# Patient Record
Sex: Female | Born: 1970 | Race: Black or African American | Hispanic: No | Marital: Single | Smoking: Current every day smoker
Health system: Southern US, Community
[De-identification: ages and names within clinical notes are randomized; demographics above are authoritative.]

## PROBLEM LIST (undated history)

## (undated) DIAGNOSIS — L0231 Cutaneous abscess of buttock: Secondary | ICD-10-CM

## (undated) DIAGNOSIS — E119 Type 2 diabetes mellitus without complications: Secondary | ICD-10-CM

## (undated) DIAGNOSIS — E785 Hyperlipidemia, unspecified: Secondary | ICD-10-CM

## (undated) DIAGNOSIS — I10 Essential (primary) hypertension: Secondary | ICD-10-CM

## (undated) HISTORY — PX: OTHER SURGICAL HISTORY: SHX169

## (undated) HISTORY — DX: Cutaneous abscess of buttock: L02.31

## (undated) HISTORY — DX: Type 2 diabetes mellitus without complications: E11.9

## (undated) HISTORY — PX: ABDOMINAL HYSTERECTOMY: SHX81

## (undated) HISTORY — DX: Hyperlipidemia, unspecified: E78.5

## (undated) HISTORY — DX: Essential (primary) hypertension: I10

---

## 2004-06-10 ENCOUNTER — Emergency Department: Payer: Self-pay | Admitting: Emergency Medicine

## 2007-02-25 ENCOUNTER — Emergency Department: Payer: Self-pay | Admitting: Unknown Physician Specialty

## 2007-06-05 ENCOUNTER — Emergency Department: Payer: Self-pay | Admitting: Emergency Medicine

## 2007-08-31 ENCOUNTER — Emergency Department: Payer: Self-pay | Admitting: Emergency Medicine

## 2008-09-12 ENCOUNTER — Emergency Department: Payer: Self-pay | Admitting: Emergency Medicine

## 2009-10-25 ENCOUNTER — Emergency Department: Payer: Self-pay | Admitting: Emergency Medicine

## 2009-11-06 ENCOUNTER — Emergency Department: Payer: Self-pay | Admitting: Emergency Medicine

## 2010-10-08 LAB — HM PAP SMEAR

## 2011-01-26 ENCOUNTER — Emergency Department: Payer: Self-pay | Admitting: Emergency Medicine

## 2013-04-28 ENCOUNTER — Inpatient Hospital Stay: Payer: Self-pay | Admitting: Obstetrics & Gynecology

## 2013-04-28 LAB — CBC WITH DIFFERENTIAL/PLATELET
BASOS PCT: 0.8 %
Basophil #: 0.1 10*3/uL (ref 0.0–0.1)
Eosinophil #: 0 10*3/uL (ref 0.0–0.7)
Eosinophil %: 0.3 %
HCT: 43 % (ref 35.0–47.0)
HGB: 14.2 g/dL (ref 12.0–16.0)
Lymphocyte #: 1.8 10*3/uL (ref 1.0–3.6)
Lymphocyte %: 9.8 %
MCH: 29.8 pg (ref 26.0–34.0)
MCHC: 33 g/dL (ref 32.0–36.0)
MCV: 90 fL (ref 80–100)
MONO ABS: 0.9 x10 3/mm (ref 0.2–0.9)
Monocyte %: 4.9 %
NEUTROS ABS: 15 10*3/uL — AB (ref 1.4–6.5)
Neutrophil %: 84.2 %
PLATELETS: 267 10*3/uL (ref 150–440)
RBC: 4.76 10*6/uL (ref 3.80–5.20)
RDW: 13.9 % (ref 11.5–14.5)
WBC: 17.8 10*3/uL — ABNORMAL HIGH (ref 3.6–11.0)

## 2013-04-28 LAB — COMPREHENSIVE METABOLIC PANEL
ALBUMIN: 3.5 g/dL (ref 3.4–5.0)
ALK PHOS: 111 U/L
ALT: 13 U/L (ref 12–78)
Anion Gap: 11 (ref 7–16)
BILIRUBIN TOTAL: 0.5 mg/dL (ref 0.2–1.0)
BUN: 6 mg/dL — AB (ref 7–18)
CHLORIDE: 106 mmol/L (ref 98–107)
Calcium, Total: 8.7 mg/dL (ref 8.5–10.1)
Co2: 30 mmol/L (ref 21–32)
Creatinine: 0.91 mg/dL (ref 0.60–1.30)
EGFR (Non-African Amer.): >60
GLUCOSE: 107 mg/dL — AB (ref 65–99)
OSMOLALITY: 291 (ref 275–301)
POTASSIUM: 3.5 mmol/L (ref 3.5–5.1)
SGOT(AST): 16 U/L (ref 15–37)
Sodium: 147 mmol/L — ABNORMAL HIGH (ref 136–145)
TOTAL PROTEIN: 7.9 g/dL (ref 6.4–8.2)

## 2013-04-28 LAB — URINALYSIS, COMPLETE
Bilirubin,UR: NEGATIVE
Glucose,UR: NEGATIVE mg/dL (ref 0–75)
Ketone: NEGATIVE
Nitrite: POSITIVE
PH: 5 (ref 4.5–8.0)
RBC,UR: 11 /HPF (ref 0–5)
Specific Gravity: 1.02 (ref 1.003–1.030)
Squamous Epithelial: 4

## 2013-04-28 LAB — LIPASE, BLOOD: Lipase: 87 U/L (ref 73–393)

## 2013-04-28 LAB — GENTAMICIN LEVEL, RANDOM: Gentamicin, Random: 0.8 ug/mL

## 2013-04-29 LAB — CBC WITH DIFFERENTIAL/PLATELET
Basophil #: 0.1 10*3/uL (ref 0.0–0.1)
Basophil %: 0.4 %
EOS PCT: 0.1 %
Eosinophil #: 0 10*3/uL (ref 0.0–0.7)
HCT: 37.2 % (ref 35.0–47.0)
HGB: 12.3 g/dL (ref 12.0–16.0)
Lymphocyte #: 1.6 10*3/uL (ref 1.0–3.6)
Lymphocyte %: 8.6 %
MCH: 29.8 pg (ref 26.0–34.0)
MCHC: 33.1 g/dL (ref 32.0–36.0)
MCV: 90 fL (ref 80–100)
Monocyte #: 0.7 x10 3/mm (ref 0.2–0.9)
Monocyte %: 4 %
NEUTROS PCT: 86.9 %
Neutrophil #: 16.3 10*3/uL — ABNORMAL HIGH (ref 1.4–6.5)
Platelet: 229 10*3/uL (ref 150–440)
RBC: 4.14 10*6/uL (ref 3.80–5.20)
RDW: 14 % (ref 11.5–14.5)
WBC: 18.7 10*3/uL — ABNORMAL HIGH (ref 3.6–11.0)

## 2013-04-29 LAB — GENTAMICIN LEVEL, PEAK: Gentamicin, Peak: 14.3 ug/mL (ref 4.0–8.0)

## 2013-04-30 LAB — CREATININE, SERUM
Creatinine: 0.71 mg/dL (ref 0.60–1.30)
EGFR (Non-African Amer.): >60

## 2013-05-01 LAB — HEMOGLOBIN: HGB: 11.7 g/dL — ABNORMAL LOW (ref 12.0–16.0)

## 2013-05-01 LAB — GENTAMICIN LEVEL, RANDOM: Gentamicin, Random: 5.5 ug/mL

## 2013-05-02 LAB — CBC WITH DIFFERENTIAL/PLATELET
BASOS ABS: 0.1 10*3/uL (ref 0.0–0.1)
Basophil %: 0.4 %
Eosinophil #: 0.1 10*3/uL (ref 0.0–0.7)
Eosinophil %: 0.4 %
HCT: 34.5 % — ABNORMAL LOW (ref 35.0–47.0)
HGB: 10.9 g/dL — ABNORMAL LOW (ref 12.0–16.0)
LYMPHS ABS: 0.9 10*3/uL — AB (ref 1.0–3.6)
Lymphocyte %: 6.7 %
MCH: 28.1 pg (ref 26.0–34.0)
MCHC: 31.5 g/dL — ABNORMAL LOW (ref 32.0–36.0)
MCV: 89 fL (ref 80–100)
MONO ABS: 0.9 x10 3/mm (ref 0.2–0.9)
MONOS PCT: 6.4 %
NEUTROS ABS: 12.2 10*3/uL — AB (ref 1.4–6.5)
NEUTROS PCT: 86.1 %
PLATELETS: 333 10*3/uL (ref 150–440)
RBC: 3.86 10*6/uL (ref 3.80–5.20)
RDW: 14 % (ref 11.5–14.5)
WBC: 14.2 10*3/uL — AB (ref 3.6–11.0)

## 2013-05-02 LAB — PATHOLOGY REPORT

## 2013-05-03 LAB — CULTURE, BLOOD (SINGLE)

## 2013-12-06 ENCOUNTER — Emergency Department: Payer: Self-pay | Admitting: Emergency Medicine

## 2013-12-06 LAB — COMPREHENSIVE METABOLIC PANEL
AST: 13 U/L — AB (ref 15–37)
Albumin: 3.6 g/dL (ref 3.4–5.0)
Alkaline Phosphatase: 109 U/L
Anion Gap: 6 — ABNORMAL LOW (ref 7–16)
BILIRUBIN TOTAL: 0.4 mg/dL (ref 0.2–1.0)
BUN: 5 mg/dL — AB (ref 7–18)
CO2: 26 mmol/L (ref 21–32)
CREATININE: 0.85 mg/dL (ref 0.60–1.30)
Calcium, Total: 8.6 mg/dL (ref 8.5–10.1)
Chloride: 109 mmol/L — ABNORMAL HIGH (ref 98–107)
GLUCOSE: 105 mg/dL — AB (ref 65–99)
Osmolality: 279 (ref 275–301)
Potassium: 4 mmol/L (ref 3.5–5.1)
SGPT (ALT): 16 U/L
Sodium: 141 mmol/L (ref 136–145)
TOTAL PROTEIN: 8 g/dL (ref 6.4–8.2)

## 2013-12-06 LAB — CBC WITH DIFFERENTIAL/PLATELET
Basophil #: 0 10*3/uL (ref 0.0–0.1)
Basophil %: 0.3 %
EOS ABS: 0 10*3/uL (ref 0.0–0.7)
Eosinophil %: 0.1 %
HCT: 44 % (ref 35.0–47.0)
HGB: 14.1 g/dL (ref 12.0–16.0)
Lymphocyte #: 0.9 10*3/uL — ABNORMAL LOW (ref 1.0–3.6)
Lymphocyte %: 7.3 %
MCH: 29.5 pg (ref 26.0–34.0)
MCHC: 32.1 g/dL (ref 32.0–36.0)
MCV: 92 fL (ref 80–100)
MONO ABS: 0.6 x10 3/mm (ref 0.2–0.9)
MONOS PCT: 4.6 %
Neutrophil #: 11.2 10*3/uL — ABNORMAL HIGH (ref 1.4–6.5)
Neutrophil %: 87.7 %
Platelet: 239 10*3/uL (ref 150–440)
RBC: 4.78 10*6/uL (ref 3.80–5.20)
RDW: 14.4 % (ref 11.5–14.5)
WBC: 12.8 10*3/uL — ABNORMAL HIGH (ref 3.6–11.0)

## 2013-12-06 LAB — TROPONIN I: Troponin-I: <0.02 ng/mL

## 2013-12-07 LAB — URINALYSIS, COMPLETE
BILIRUBIN, UR: NEGATIVE
Bacteria: NONE SEEN
Blood: NEGATIVE
GLUCOSE, UR: NEGATIVE mg/dL (ref 0–75)
Ketone: NEGATIVE
LEUKOCYTE ESTERASE: NEGATIVE
Nitrite: NEGATIVE
Ph: 8 (ref 4.5–8.0)
Protein: NEGATIVE
RBC,UR: 6 /HPF (ref 0–5)
Specific Gravity: 1.016 (ref 1.003–1.030)
Squamous Epithelial: 1
WBC UR: NONE SEEN /HPF (ref 0–5)

## 2013-12-10 LAB — INFLUENZA A,B,H1N1 - PCR (ARMC)
H1N1 flu by pcr: NOT DETECTED
Influenza A By PCR: NEGATIVE
Influenza B By PCR: NEGATIVE

## 2014-06-15 NOTE — Op Note (Signed)
PATIENT NAME:  Kelsey Sampson, PROVENCIO MR#:  [id number] DATE OF BIRTH:  Aug 14, 1970  DATE OF PROCEDURE:  04/30/2013  PREOPERATIVE DIAGNOSES: Abdominal pain, bilateral tubo-ovarian abscess.   POSTOPERATIVE DIAGNOSES: Abdominal pain, bilateral tubo-ovarian abscess.    PROCEDURE: Laparoscopy, laparotomy, right salpingectomy, left salpingo-oophorectomy, lysis of adhesions,   SURGEON: R. Annamarie Major, M.D.   ANESTHESIA: General.   BLOOD LOSS: 500 mL.   COMPLICATIONS: None.   FINDINGS: The patient had significant inflammation and adhesions associated with the bilateral tubo-ovarian abscess formations that involved many bowel, colon structures in the pelvis but minimally involved the uterus. The adnexa, including the ovaries, were significantly involved, with hopeful preservation of the right ovary.   DISPOSITION: To the recovery room in stable condition.   TECHNIQUE: The patient is prepped and draped in the usual sterile fashion after adequate general anesthesia is obtained in the dorsal lithotomy position. Foley catheter is inserted. Cervix is grasped with a tenaculum for manipulation purposes.   Attention is then turned to the abdomen where a Veress needle is inserted through a 5 mm infraumbilical incision after Marcaine is used to anesthetize the skin. Veress needle placement is confirmed using the hanging drop technique, and the abdomen is then insufflated with CO2 gas. A 5 mm trocar is then inserted under direct visualization with the laparoscope with no injuries or bleeding noted. The patient is placed in Trendelenburg positioning. The above-mentioned findings are visualized with significant adhesions with bowel, colon to the adnexa and uterus. There is also significant exudate and inflammatory tissue and purulent fluid. Trocars are placed in the left and right lower quadrants 5 mL each under direct visualization with the laparoscope with no injuries or bleeding noted. Careful dissection is performed  over a significant amount of time to free up to the adnexa, uterus and colon tissues and bowel tissues. It is determined that the masses associated with the tubo-ovarian abscesses are significant and disease prevalent and needs to have further procedures done but cannot be accomplished by laparoscopy, so a decision is made to convert to laparotomy.   A low transverse skin incision is created with a scalpel down to the level of the rectus fascia which is then dissected bilaterally. The rectus muscles are separated from the fascia and then separated in the midline once adequate dissection is performed and the peritoneum is penetrated and the gas is let out from the laparoscopy. A Balfour retractor is inserted for retraction purposes, and the bladder is inferiorly retracted as well with a bladder blade.   Careful dissection, mostly by blunt dissection as well as sharp dissection, is performed to free up the adnexa from surrounding tissues. The left adnexal mass associated with this is removed with careful dissection on the lateral aspect to avoid the ureter. The ureter is palpated to be out of harm's way after completing this part of the dissection. Careful clamping across the infundibulopelvic blood vessels and ligaments as well as the uterine artery is done in the course of this dissection, and this mass that probably includes the left ovary and certainly includes the tubal mass, is removed and sent to pathology for further review. The uterus is normal and otherwise nonbleeding on this side. On the right side, the tube is dissected with clamping and cutting of the main blood supply and preservation of the right ovary on this side. Careful inspection of colon, bowel, ureters, bladder and uterus reveals no other significant disease or injury. The pelvic cavity is irrigated with copious amounts of saline.  Approximately 3000 mL is used to irrigate the pelvis and abdomen, with aspiration of as much fluid as possible.  Excellent hemostasis is visualized throughout the operative site. A drain is placed in the posterior cul-de-sac. We used a JP drain and it is exited through the right laparoscopic port site. The patient is leveled from the Trendelenburg position. Then, the rectus fascia is closed with a 0 PDS suture. Skin is closed with surgical clips at the laparotomy site and with Dermabond at laparoscopy sites. The drain is tied in place with nylon suture and is placed to bulb grenade suction. The patient goes to the recovery room in stable condition, tolerating the procedure well. All sponge, instrument and needle counts are correct at the conclusion of the case. The patient goes to recovery room with a Foley catheter and with the JP drain in place and with continued use of antibiotics.   ____________________________ R. Annamarie MajorPaul Rayel Santizo, MD rph:gb D: 04/30/2013 17:34:21 ET T: 04/30/2013 22:26:07 ET JOB#: [id number]  cc: Dierdre Searles. Paul Airis Barbee, MD, <Dictator> Nadara MustardOBERT P Sanav Remer MD ELECTRONICALLY SIGNED 04/30/2013 23:30

## 2014-08-29 ENCOUNTER — Emergency Department
Admission: EM | Admit: 2014-08-29 | Discharge: 2014-08-29 | Discharge status: Against Medical Advice | Payer: No Typology Code available for payment source | Attending: Emergency Medicine | Admitting: Emergency Medicine

## 2014-08-29 ENCOUNTER — Encounter: Payer: Self-pay | Admitting: Urgent Care

## 2014-08-29 DIAGNOSIS — S3992XA Unspecified injury of lower back, initial encounter: Secondary | ICD-10-CM | POA: Diagnosis not present

## 2014-08-29 DIAGNOSIS — Y92410 Unspecified street and highway as the place of occurrence of the external cause: Secondary | ICD-10-CM | POA: Diagnosis not present

## 2014-08-29 DIAGNOSIS — Z720 Tobacco use: Secondary | ICD-10-CM | POA: Insufficient documentation

## 2014-08-29 DIAGNOSIS — S199XXA Unspecified injury of neck, initial encounter: Secondary | ICD-10-CM | POA: Insufficient documentation

## 2014-08-29 DIAGNOSIS — Y9389 Activity, other specified: Secondary | ICD-10-CM | POA: Diagnosis not present

## 2014-08-29 DIAGNOSIS — Y998 Other external cause status: Secondary | ICD-10-CM | POA: Insufficient documentation

## 2014-08-29 MED ORDER — OXYCODONE-ACETAMINOPHEN 5-325 MG PO TABS
ORAL_TABLET | ORAL | Status: AC
  Filled 2014-08-29: qty 1

## 2014-08-29 MED ORDER — OXYCODONE-ACETAMINOPHEN 5-325 MG PO TABS
1.0000 | ORAL_TABLET | Freq: Once | ORAL | Status: AC
  Administered 2014-08-29: 1 via ORAL
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
## 2014-08-29 NOTE — ED Notes (Signed)
Called from lobby for room assignment, no response  

## 2017-09-02 LAB — HM HIV SCREENING LAB: HM HIV Screening: NEGATIVE

## 2017-09-08 ENCOUNTER — Encounter: Payer: Self-pay | Admitting: *Deleted

## 2017-09-08 ENCOUNTER — Other Ambulatory Visit: Payer: Self-pay

## 2017-09-08 ENCOUNTER — Emergency Department
Admission: EM | Admit: 2017-09-08 | Discharge: 2017-09-08 | Disposition: A | Discharge status: Home / Self Care | Payer: Self-pay | Attending: Emergency Medicine | Admitting: Emergency Medicine

## 2017-09-08 DIAGNOSIS — L0231 Cutaneous abscess of buttock: Secondary | ICD-10-CM | POA: Insufficient documentation

## 2017-09-08 DIAGNOSIS — F17200 Nicotine dependence, unspecified, uncomplicated: Secondary | ICD-10-CM | POA: Insufficient documentation

## 2017-09-08 MED ORDER — CLINDAMYCIN HCL 150 MG PO CAPS
300.0000 mg | ORAL_CAPSULE | Freq: Once | ORAL | Status: AC
  Administered 2017-09-08: 300 mg via ORAL
  Filled 2017-09-08: qty 2

## 2017-09-08 MED ORDER — CLINDAMYCIN HCL 300 MG PO CAPS: 300.0000 mg | ORAL_CAPSULE | Freq: Four times a day (QID) | ORAL | 0 refills | Status: DC

## 2017-09-08 NOTE — ED Provider Notes (Signed)
[facility] Emergency Department Provider Note  ____________________________________________  Time seen: Approximately 7:30 PM  I have reviewed the triage vital signs and the nursing notes.   HISTORY  Chief Complaint Abscess    HPI Kelsey Sampson is a 47 y.o. female who presents the emergency department complaining of abscess to her buttocks.  Patient reports that this is the ninth time she has had an abscess in the same location.  She states that multiple times it will rupture, drained for a few days, then resolved.  Patient has been treated intermittently with incision and drainage, antibiotics, or it has ruptured and spontaneously resolved without medical care.  Patient reports that this is on her buttocks and not around the perirectal area.  Patient denies any IBS, Crohn's, celiac, colitis, diverticulitis history.  She denies any abdominal pain, diarrhea, rectal bleeding.  Patient denies any other systemic complaints of fevers or chills, nausea, vomiting, diarrhea or constipation.  No medications for this complaint prior to arrival.  Patient reports that it had not ruptured over the past several days but when she arrived to the emergency department it ruptured spontaneously.    History reviewed. No pertinent past medical history.  There are no active problems to display for this patient.   Past Surgical History:  Procedure Laterality Date  . ABDOMINAL HYSTERECTOMY      Prior to Admission medications   Medication Sig Start Date End Date Taking? Authorizing Provider  clindamycin (CLEOCIN) 300 MG capsule Take 1 capsule (300 mg total) by mouth 4 (four) times daily. 09/08/17   Portia Wisdom, Delorise RoyalsJonathan D, PA-C    Allergies Patient has no known allergies.  History reviewed. No pertinent family history.  Social History Social History   Tobacco Use  . Smoking status: Current Every Day Smoker  . Smokeless tobacco: Never Used  Substance Use Topics  . Alcohol  use: No  . Drug use: Not on file     Review of Systems  Constitutional: No fever/chills Eyes: No visual changes.  Cardiovascular: no chest pain. Respiratory: no cough. No SOB. Gastrointestinal: No abdominal pain.  No nausea, no vomiting.  No diarrhea.  No constipation. Genitourinary: Negative for dysuria. No hematuria Musculoskeletal: Negative for musculoskeletal pain. Skin: Negative for rash, abrasions, lacerations, ecchymosis.  Positive for abscess to the right buttocks. Neurological: Negative for headaches, focal weakness or numbness. 10-point ROS otherwise negative.  ____________________________________________   PHYSICAL EXAM:  VITAL SIGNS: ED Triage Vitals  Enc Vitals Group     BP 09/08/17 1907 (!) 148/74     Pulse Rate 09/08/17 1907 85     Resp 09/08/17 1907 16     Temp 09/08/17 1907 98.6 F (37 C)     Temp Source 09/08/17 1907 Oral     SpO2 09/08/17 1907 99 %     Weight 09/08/17 1908 259 lb (117.5 kg)     Height 09/08/17 1908 5\' 9"  (1.753 m)     Head Circumference --      Peak Flow --      Pain Score 09/08/17 1907 6     Pain Loc --      Pain Edu? --      Excl. in GC? --      Constitutional: Alert and oriented. Well appearing and in no acute distress. Eyes: Conjunctivae are normal. PERRL. EOMI. Head: Atraumatic. Neck: No stridor.    Cardiovascular: Normal rate, regular rhythm. Normal S1 and S2.  Good peripheral circulation. Respiratory: Normal respiratory effort without tachypnea  or retractions. Lungs CTAB. Good air entry to the bases with no decreased or absent breath sounds. Gastrointestinal: Bowel sounds 4 quadrants. Soft and nontender to palpation. No guarding or rigidity. No palpable masses. No distention.  Visualization of the rectum is unremarkable. Musculoskeletal: Full range of motion to all extremities. No gross deformities appreciated. Neurologic:  Normal speech and language. No gross focal neurologic deficits are appreciated.  Skin:  Skin is  warm, dry and intact. No rash noted.  Small, draining skin lesion noted to the right buttocks.  This is just lateral to the intergluteal cleft.  This does not encompass the intergluteal cleft and is not in the perirectal region.  Area is draining a purulent/bloody drainage.  Palpation expresses more drainage.  On palpation, mild fluctuance. Psychiatric: Mood and affect are normal. Speech and behavior are normal. Patient exhibits appropriate insight and judgement.  Exam chaperoned by female RN ____________________________________________   LABS (all labs ordered are listed, but only abnormal results are displayed)  Labs Reviewed - No data to display ____________________________________________  EKG   ____________________________________________  RADIOLOGY   No results found.  ____________________________________________    PROCEDURES  Procedure(s) performed:    Procedures    Medications  clindamycin (CLEOCIN) capsule 300 mg (has no administration in time range)     ____________________________________________   INITIAL IMPRESSION / ASSESSMENT AND PLAN / ED COURSE  Pertinent labs & imaging results that were available during my care of the patient were reviewed by me and considered in my medical decision making (see chart for details).  Review of the [place] CSRS was performed in accordance of the [facility] prior to dispensing any controlled drugs.      Patient's diagnosis is consistent with buttocks abscess.  Patient presents the emergency department for an abscess to the right buttocks.  On exam, this is spontaneously draining a purulent/bloody drainage.  This is not in the perirectal region.  Patient reports that this is the ninth abscess she has had in the same location.  I suspect that patient does have a fistula but given overall reassuring exam, spontaneous drainage, no imaging is obtained at this time as there is no concern for perirectal involvement.  Patient will be  treated with clindamycin.  Patient is advised to follow-up with general surgery for further evaluation of possible fistula.  Patient verbalizes understanding of same..  Instructions to return for any rectal pain, rectal bleeding, increasing pain or drainage to the site.  Patient verbalizes understanding of same.  Patient is to follow-up with general surgery as described.  Patient is given ED precautions to return to the ED for any worsening or new symptoms.     ____________________________________________  FINAL CLINICAL IMPRESSION(S) / ED DIAGNOSES  Final diagnoses:  Abscess of buttock, right      NEW MEDICATIONS STARTED DURING THIS VISIT:  ED Discharge Orders        Ordered    clindamycin (CLEOCIN) 300 MG capsule  4 times daily     09/08/17 1936          This chart was dictated using voice recognition software/Dragon. Despite best efforts to proofread, errors can occur which can change the meaning. Any change was purely unintentional.    Racheal Patches, PA-C 09/08/17 1938    Myrna Blazer, MD 09/08/17 [id number]

## 2017-09-08 NOTE — ED Triage Notes (Signed)
Pt reports an abscess in the middle of her glutes that has been coming and going over the past couple months. Usually the abscess will drain on its own but this time is will not and the pain has increased.No difficulty having BMs and no fevers at home.

## 2017-09-08 NOTE — ED Notes (Signed)
This RN at bedside with PA as witness for exam

## 2017-09-15 DIAGNOSIS — L0231 Cutaneous abscess of buttock: Secondary | ICD-10-CM | POA: Insufficient documentation

## 2017-09-19 ENCOUNTER — Ambulatory Visit: Payer: Self-pay | Admitting: Surgery

## 2017-09-19 ENCOUNTER — Encounter: Payer: Self-pay | Admitting: Surgery

## 2017-09-19 VITALS — BP 130/80 | HR 79 | Ht 69.0 in | Wt 257.0 lb

## 2017-09-19 DIAGNOSIS — L0231 Cutaneous abscess of buttock: Secondary | ICD-10-CM

## 2017-09-19 NOTE — Patient Instructions (Addendum)
Start your antibobitic and return as needed. The patient is aware to call back for any questions or concerns.

## 2017-09-19 NOTE — Progress Notes (Signed)
Patient ID: Kelsey Sampson, female   DOB: April 23, 1970, 47 y.o.   MRN: [id number]  HPI Kelsey Sampson is a 47 y.o. female recently seen in the emergency room 10 days ago for a spontaneous perianal abscess.  She reported that she had significant sharp constant pain that was moderate in intensity.  Also reports spontaneous drainage of an abscess about 10 days ago and was given antibiotics.  She so far has been doing very well and her pain has subsided.  No fevers no chills.  She reports that this has been her ninth episode of of an abscess.  She is able to perform more than 4 METS of activity without any shortness of breath or chest pain  HPI  Past Medical History:  Diagnosis Date  . Abscess of buttock, left     Past Surgical History:  Procedure Laterality Date  . ABDOMINAL HYSTERECTOMY      No family history on file.  Social History Social History   Tobacco Use  . Smoking status: Current Every Day Smoker  . Smokeless tobacco: Never Used  Substance Use Topics  . Alcohol use: No  . Drug use: Not on file    No Known Allergies  Current Outpatient Medications  Medication Sig Dispense Refill  . clindamycin (CLEOCIN) 300 MG capsule Take 1 capsule (300 mg total) by mouth 4 (four) times daily. (Patient not taking: Reported on 09/19/2017) 28 capsule 0   No current facility-administered medications for this visit.      Review of Systems Full ROS  was asked and was negative except for the information on the HPI  Physical Exam Blood pressure 130/80, pulse 79, height 5\' 9"  (1.753 m), weight 257 lb (116.6 kg). CONSTITUTIONAL: NAD, awake and alert GI: The abdomen is  soft, nontender, and nondistended. There are no palpable masses. There is no hepatosplenomegaly. There are normal bowel sounds in all quadrants. Rectal , no masses, fistula or abscess. Non tender exam MUSCULOSKELETAL: Normal muscle strength and tone. No cyanosis or edema.   SKIN: Turgor is good and there are no pathologic  skin lesions or ulcers. NEUROLOGIC: Motor and sensation is grossly normal. Cranial nerves are grossly intact. PSYCH:  Oriented to person, place and time. Affect is normal.  Data Reviewed  I have personally reviewed the patient's imaging, laboratory findings and medical records.    Assessment/Plan Resolving perianal abscess.  No need for any surgical intervention.  Recommend to do sitz baths and complete the antibiotic therapy.  Also discussed with her that there is a chance that this may recur and transfer him to a fistula that will require an exam under anesthesia and fistulotomy.  At this time no need for immediate surgical intervention.  To the office as needed  Sterling Bigiego Rowena Moilanen, MD FACS General Surgeon 09/19/2017, 4:30 PM

## 2018-03-26 ENCOUNTER — Encounter: Payer: Self-pay | Admitting: Emergency Medicine

## 2018-03-26 ENCOUNTER — Emergency Department: Payer: Self-pay

## 2018-03-26 DIAGNOSIS — F17200 Nicotine dependence, unspecified, uncomplicated: Secondary | ICD-10-CM | POA: Insufficient documentation

## 2018-03-26 DIAGNOSIS — M25562 Pain in left knee: Secondary | ICD-10-CM | POA: Insufficient documentation

## 2018-03-26 NOTE — ED Triage Notes (Addendum)
Patient with complaint of left  knee pain times 5 days. Patient states that she does not recall any injury.

## 2018-03-27 ENCOUNTER — Other Ambulatory Visit: Payer: Self-pay

## 2018-03-27 ENCOUNTER — Emergency Department
Admission: EM | Admit: 2018-03-27 | Discharge: 2018-03-27 | Disposition: A | Discharge status: Home / Self Care | Payer: Self-pay | Attending: Emergency Medicine | Admitting: Emergency Medicine

## 2018-03-27 DIAGNOSIS — M25562 Pain in left knee: Secondary | ICD-10-CM

## 2018-03-27 LAB — URIC ACID: Uric Acid, Serum: 4.5 mg/dL (ref 2.5–7.1)

## 2018-03-27 MED ORDER — METHYLPREDNISOLONE SODIUM SUCC 125 MG IJ SOLR
125.0000 mg | Freq: Once | INTRAMUSCULAR | Status: AC
  Administered 2018-03-27: 125 mg via INTRAVENOUS
  Filled 2018-03-27: qty 2

## 2018-03-27 MED ORDER — TRAMADOL HCL 50 MG PO TABS: 50.0000 mg | ORAL_TABLET | Freq: Four times a day (QID) | ORAL | 0 refills | Status: AC | PRN

## 2018-03-27 MED ORDER — KETOROLAC TROMETHAMINE 30 MG/ML IJ SOLN
30.0000 mg | Freq: Once | INTRAMUSCULAR | Status: AC
  Administered 2018-03-27: 30 mg via INTRAVENOUS
  Filled 2018-03-27: qty 1

## 2018-03-27 NOTE — ED Notes (Signed)
Pt states that the last 5 days her left knee has been feeling swollen and painful. States she has not fallen and is not sure why it's swollen. Family at bedside.

## 2018-03-27 NOTE — ED Provider Notes (Signed)
[facility] Emergency Department Provider Note   First MD Initiated Contact with Patient 03/27/18 0142     (approximate)  I have reviewed the triage vital signs and the nursing notes.   HISTORY  Chief Complaint Knee Pain    HPI Kelsey Sampson is a 48 y.o. female resents to the emergency department with nontraumatic left anterior knee pain x5 days.  Patient does not recall any injury.  Patient states that pain is worse with any bending of the knee or ambulation.  Patient states current pain score is 8 out of 10.  Patient denies any fever.     Past Medical History:  Diagnosis Date  . Abscess of buttock, left     Patient Active Problem List   Diagnosis Date Noted  . Abscess of buttock, left     Past Surgical History:  Procedure Laterality Date  . ABDOMINAL HYSTERECTOMY      Prior to Admission medications   Medication Sig Start Date End Date Taking? Authorizing Provider  clindamycin (CLEOCIN) 300 MG capsule Take 1 capsule (300 mg total) by mouth 4 (four) times daily. Patient not taking: Reported on 09/19/2017 09/08/17   Cuthriell, Delorise RoyalsJonathan D, PA-C    Allergies Patient has no known allergies.  No family history on file.  Social History Social History   Tobacco Use  . Smoking status: Current Every Day Smoker  . Smokeless tobacco: Never Used  Substance Use Topics  . Alcohol use: No  . Drug use: Not on file    Review of Systems Constitutional: No fever/chills Eyes: No visual changes. ENT: No sore throat. Cardiovascular: Denies chest pain. Respiratory: Denies shortness of breath. Gastrointestinal: No abdominal pain.  No nausea, no vomiting.  No diarrhea.  No constipation. Genitourinary: Negative for dysuria. Musculoskeletal: Negative for neck pain.  Negative for back pain. Integumentary: Negative for rash. Neurological: Negative for headaches, focal weakness or numbness.   ____________________________________________   PHYSICAL  EXAM:  VITAL SIGNS: ED Triage Vitals  Enc Vitals Group     BP 03/26/18 2020 (!) 159/88     Pulse Rate 03/26/18 2019 79     Resp 03/26/18 2019 18     Temp 03/26/18 2019 98.8 F (37.1 C)     Temp src --      SpO2 03/26/18 2019 96 %     Weight 03/26/18 2019 108.9 kg (240 lb)     Height 03/26/18 2019 1.753 m (5\' 9" )     Head Circumference --      Peak Flow --      Pain Score 03/26/18 2019 10     Pain Loc --      Pain Edu? --      Excl. in GC? --     Constitutional: Alert and oriented. Well appearing and in no acute distress. Eyes: Conjunctivae are normal.  Mouth/Throat: Mucous membranes are moist.  Oropharynx non-erythematous. Neck: No stridor.   Cardiovascular: Normal rate, regular rhythm. Good peripheral circulation. Grossly normal heart sounds. Respiratory: Normal respiratory effort.  No retractions. Lungs CTAB. Gastrointestinal: Soft and nontender. No distention.  Musculoskeletal: Pain with palpation superior portion of the knee.  No palpable fluid collection.  Pain with anterior valgus varus stress test. Neurologic:  Normal speech and language. No gross focal neurologic deficits are appreciated.  Skin:  Skin is warm, dry and intact. No rash noted.  ____________________________________________   LABS (all labs ordered are listed, but only abnormal results are displayed)  Labs Reviewed  URIC ACID    RADIOLOGY I, Shawnee N BROWN, personally viewed and evaluated these images (plain radiographs) as part of my medical decision making, as well as reviewing the written report by the radiologist.  ED MD interpretation: Possible small joint effusion but otherwise negative x-ray per the radiologist guarding x-ray of the left knee.  Official radiology report(s): Dg Knee Complete 4 Views Left  Result Date: 03/26/2018 CLINICAL DATA:  48 year old female with 5 days of left knee pain, medial pain. No known injury. EXAM: LEFT KNEE - COMPLETE 4+ VIEW COMPARISON:  None. FINDINGS:  Preserved joint spaces and alignment. Possible small suprapatellar joint effusion. Patella intact. Normal bone mineralization and no osseous abnormality identified. IMPRESSION: Possible small joint effusion but otherwise negative radiographic appearance of the left knee. Electronically Signed   By: Odessa Fleming M.D.   On: 03/26/2018 20:48     Procedures   ____________________________________________   INITIAL IMPRESSION / ASSESSMENT AND PLAN / ED COURSE  As part of my medical decision making, I reviewed the following data within the electronic MEDICAL RECORD NUMBER   48 year old female presented with above-stated history and physical exam secondary to nontraumatic left knee pain.  Torrential diagnosis include arthritis ligamentous injury gout.  Of note patient had no calf or posterior knee pain therefore possibility of DVT unlikely.  X-ray revealed no evidence of fracture dislocation small joint effusion noted.  Uric acid obtained which was negative at 4.5.  Patient given IV Toradol and Solu-Medrol with pain improvement knee immobilizer applied patient given crutches with recommendation to follow-up with orthopedic surgery for further outpatient evaluation. ____________________________________________  FINAL CLINICAL IMPRESSION(S) / ED DIAGNOSES  Final diagnoses:  Acute pain of left knee     MEDICATIONS GIVEN DURING THIS VISIT:  Medications  ketorolac (TORADOL) 30 MG/ML injection 30 mg (30 mg Intravenous Given 03/27/18 0213)  methylPREDNISolone sodium succinate (SOLU-MEDROL) 125 mg/2 mL injection 125 mg (125 mg Intravenous Given 03/27/18 0213)     ED Discharge Orders    None       Note:  This document was prepared using Dragon voice recognition software and may include unintentional dictation errors.    Darci Current, MD 03/27/18 [phone]

## 2018-10-27 ENCOUNTER — Other Ambulatory Visit: Payer: Self-pay

## 2018-10-27 ENCOUNTER — Ambulatory Visit: Payer: Self-pay | Admitting: Physician Assistant

## 2018-10-27 ENCOUNTER — Encounter: Payer: Self-pay | Admitting: Physician Assistant

## 2018-10-27 DIAGNOSIS — Z113 Encounter for screening for infections with a predominantly sexual mode of transmission: Secondary | ICD-10-CM

## 2018-10-27 DIAGNOSIS — A5901 Trichomonal vulvovaginitis: Secondary | ICD-10-CM

## 2018-10-27 LAB — WET PREP FOR TRICH, YEAST, CLUE
Trichomonas Exam: POSITIVE — AB
Yeast Exam: NEGATIVE

## 2018-10-27 MED ORDER — METRONIDAZOLE 500 MG PO TABS: 500.0000 mg | ORAL_TABLET | Freq: Once | ORAL | 0 refills | Status: AC

## 2018-10-27 NOTE — Progress Notes (Signed)
STI clinic/screening visit  Subjective:  Kelsey Sampson is a 48 y.o. female being seen today for an STI screening visit. The patient reports they do have symptoms.  Patient has the following medical conditions:   Patient Active Problem List   Diagnosis Date Noted  . Abscess of buttock, left      Chief Complaint  Patient presents with  . SEXUALLY TRANSMITTED DISEASE    HPI  Patient reports that she has had a green discharge with odor for 3 days and itching/irritation for 2 days.  Denies other symptoms.  Had a hysterectomy about 5 years ago due to infection.   See flowsheet for further details and programmatic requirements.    The following portions of the patient's history were reviewed and updated as appropriate: allergies, current medications, past medical history, past social history, past surgical history and problem list.  Objective:  There were no vitals filed for this visit.  Physical Exam Constitutional:      General: She is not in acute distress.    Appearance: Normal appearance.  HENT:     Head: Normocephalic and atraumatic.     Mouth/Throat:     Mouth: Mucous membranes are moist.     Pharynx: Oropharynx is clear. No oropharyngeal exudate or posterior oropharyngeal erythema.  Eyes:     Conjunctiva/sclera: Conjunctivae normal.  Neck:     Musculoskeletal: Neck supple.  Pulmonary:     Effort: Pulmonary effort is normal.  Abdominal:     Palpations: Abdomen is soft. There is no mass.     Tenderness: There is no abdominal tenderness. There is no guarding or rebound.  Genitourinary:    General: Normal vulva.     Rectum: Normal.     Comments: External genitalia/pubic area without nits, lice, edema, erythema, lesions, and inguinal adenopathy. Vagina with erythematous mucosa, moderate amount of yellowish/brownish, thin, vaginal discharge, pH= >4.5. Cervix and uterus surgically absent.  Lymphadenopathy:     Cervical: No cervical adenopathy.  Skin:    General:  Skin is warm and dry.     Findings: No bruising, erythema, lesion or rash.  Neurological:     Mental Status: She is alert and oriented to person, place, and time.  Psychiatric:        Mood and Affect: Mood normal.        Behavior: Behavior normal.        Thought Content: Thought content normal.        Judgment: Judgment normal.       Assessment and Plan:  Kelsey Sampson is a 48 y.o. female presenting to the [facility] Department for STI screening  1. Screening for STD (sexually transmitted disease) Patient with symptoms today.   Await test results.  Counseled that RN will call if needs to RTC for any further treatment once results are back. Rec condoms with all sex. - WET PREP FOR [place], YEAST, CLUE - Chlamydia/Gonorrhea [place] [facility] - HIV [place] [facility] - Syphilis Serology, [place] [facility]  2. Trichomonal vaginitis Will treat for Trich with Metronidazole 2 g po with food, no EtOH for 24 hr before and until 72 hr after completing medicine. No sex for 7 days and until after partner completes treatment.  Counseled to use OTC antifungal cream for symptom relief of itching.  - metroNIDAZOLE (FLAGYL) 500 MG tablet; Take 4 tablets by mouth once for 1 dose  Dispense: 4 tablet; Refill: 0     No follow-ups on file.  No future appointments.  Jerene Dilling, PA

## 2019-10-23 ENCOUNTER — Ambulatory Visit (LOCAL_COMMUNITY_HEALTH_CENTER): Payer: Self-pay

## 2019-10-23 ENCOUNTER — Other Ambulatory Visit: Payer: Self-pay

## 2019-10-23 DIAGNOSIS — Z719 Counseling, unspecified: Secondary | ICD-10-CM

## 2019-10-23 NOTE — Progress Notes (Signed)
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 Pt states company ([employer]) is to pay for PPD and has reimbursement form in hand. Upon review by [facility] Dagoberto Reef, [facility] unable to proceed with PPD as company has outstanding balance at [facility] for ppd's and [facility] now requires patients to pay up front before ppd placed. RN and clerk explained this to pt and pt agrees to reschedule ppd as she does not have funds today for ppd. Jerel Shepherd, RN

## 2019-10-26 ENCOUNTER — Ambulatory Visit: Payer: Self-pay

## 2019-10-30 ENCOUNTER — Other Ambulatory Visit: Payer: Self-pay

## 2020-01-02 ENCOUNTER — Ambulatory Visit: Payer: Self-pay | Admitting: Physician Assistant

## 2020-01-02 ENCOUNTER — Other Ambulatory Visit: Payer: Self-pay

## 2020-01-02 DIAGNOSIS — Z299 Encounter for prophylactic measures, unspecified: Secondary | ICD-10-CM

## 2020-01-02 DIAGNOSIS — Z113 Encounter for screening for infections with a predominantly sexual mode of transmission: Secondary | ICD-10-CM

## 2020-01-02 LAB — WET PREP FOR TRICH, YEAST, CLUE
Trichomonas Exam: NEGATIVE
Yeast Exam: NEGATIVE

## 2020-01-02 MED ORDER — CLOTRIMAZOLE 1 % VA CREA: 1.0000 | TOPICAL_CREAM | Freq: Every day | VAGINAL | 0 refills | Status: DC

## 2020-01-04 ENCOUNTER — Encounter: Payer: Self-pay | Admitting: Physician Assistant

## 2020-01-04 NOTE — Progress Notes (Signed)
[facility] Department STI clinic/screening visit  Subjective:  Kelsey Sampson is a 49 y.o. female being seen today for an STI screening visit. The patient reports they do have symptoms.  Patient reports that they do not desire a pregnancy in the next year.   They reported they are not interested in discussing contraception today.  No LMP recorded. Patient has had a hysterectomy.   Patient has the following medical conditions:   Patient Active Problem List   Diagnosis Date Noted  . Abscess of buttock, left     Chief Complaint  Patient presents with  . SEXUALLY TRANSMITTED DISEASE    screening    HPI  Patient reports that she has had some vaginal irritation for about 5 days.  Denies other symptoms, chronic conditions and regular medicines.  Reports that she had her last HIV test earlier this year and last pap in 2016 prior to her hysterectomy.  See flowsheet for further details and programmatic requirements.    The following portions of the patient's history were reviewed and updated as appropriate: allergies, current medications, past medical history, past social history, past surgical history and problem list.  Objective:  There were no vitals filed for this visit.  Physical Exam Constitutional:      General: She is not in acute distress.    Appearance: Normal appearance.  HENT:     Head: Normocephalic and atraumatic.     Comments: No nits,lice, or hair loss. No cervical, supraclavicular or axillary adenopathy.    Mouth/Throat:     Mouth: Mucous membranes are moist.     Pharynx: Oropharynx is clear. No oropharyngeal exudate or posterior oropharyngeal erythema.  Eyes:     Conjunctiva/sclera: Conjunctivae normal.  Pulmonary:     Effort: Pulmonary effort is normal.  Abdominal:     Palpations: Abdomen is soft. There is no mass.     Tenderness: There is no abdominal tenderness. There is no guarding or rebound.  Genitourinary:    General: Normal vulva.      Rectum: Normal.     Comments: External genitalia/pubic area without nits, lice, edema, erythema, lesions and inguinal adenopathy. Vagina with normal mucosa and a small amount of thick white clumping discharge. Cervix without visible lesions. Uterus firm, mobile, nt, no masses, no CMT, no adnexal tenderness or fullness. Musculoskeletal:     Cervical back: Neck supple. No tenderness.  Skin:    General: Skin is warm and dry.     Findings: No bruising, erythema, lesion or rash.  Neurological:     Mental Status: She is alert and oriented to person, place, and time.  Psychiatric:        Mood and Affect: Mood normal.        Behavior: Behavior normal.        Thought Content: Thought content normal.        Judgment: Judgment normal.      Assessment and Plan:  Kelsey Sampson is a 49 y.o. female presenting to the [facility] Department for STI screening  1. Screening for STD (sexually transmitted disease) Patient into clinic with symptoms. Rec condoms with all sex. Await test results.  Counseled that RN will call if needs to RTC for treatment once results are back. - WET PREP FOR TRICH, YEAST, CLUE - Chlamydia/Gonorrhea [place] [facility] - HIV [place] [facility] - Syphilis Serology, [place] [facility]  2. Prophylactic measure Will give Clotrimazole 1% vaginal cream 1 app qhs for 7 days due  to symptoms and exam findings. No sex for 7 days. - clotrimazole (CLOTRIMAZOLE-7) 1 % vaginal cream; Place 1 Applicatorful vaginally at bedtime.  Dispense: 45 g; Refill: 0     No follow-ups on file.  No future appointments.  Matt Holmes, PA

## 2020-06-23 IMAGING — CR DG KNEE COMPLETE 4+V*L*
4 series · 4 of 4 positions shown · non-contrast
Comparison: None.

CLINICAL DATA: 47-year-old female with 5 days of left knee pain,
medial pain. No known injury.

EXAM:
LEFT KNEE - COMPLETE 4+ VIEW

[knee ap]
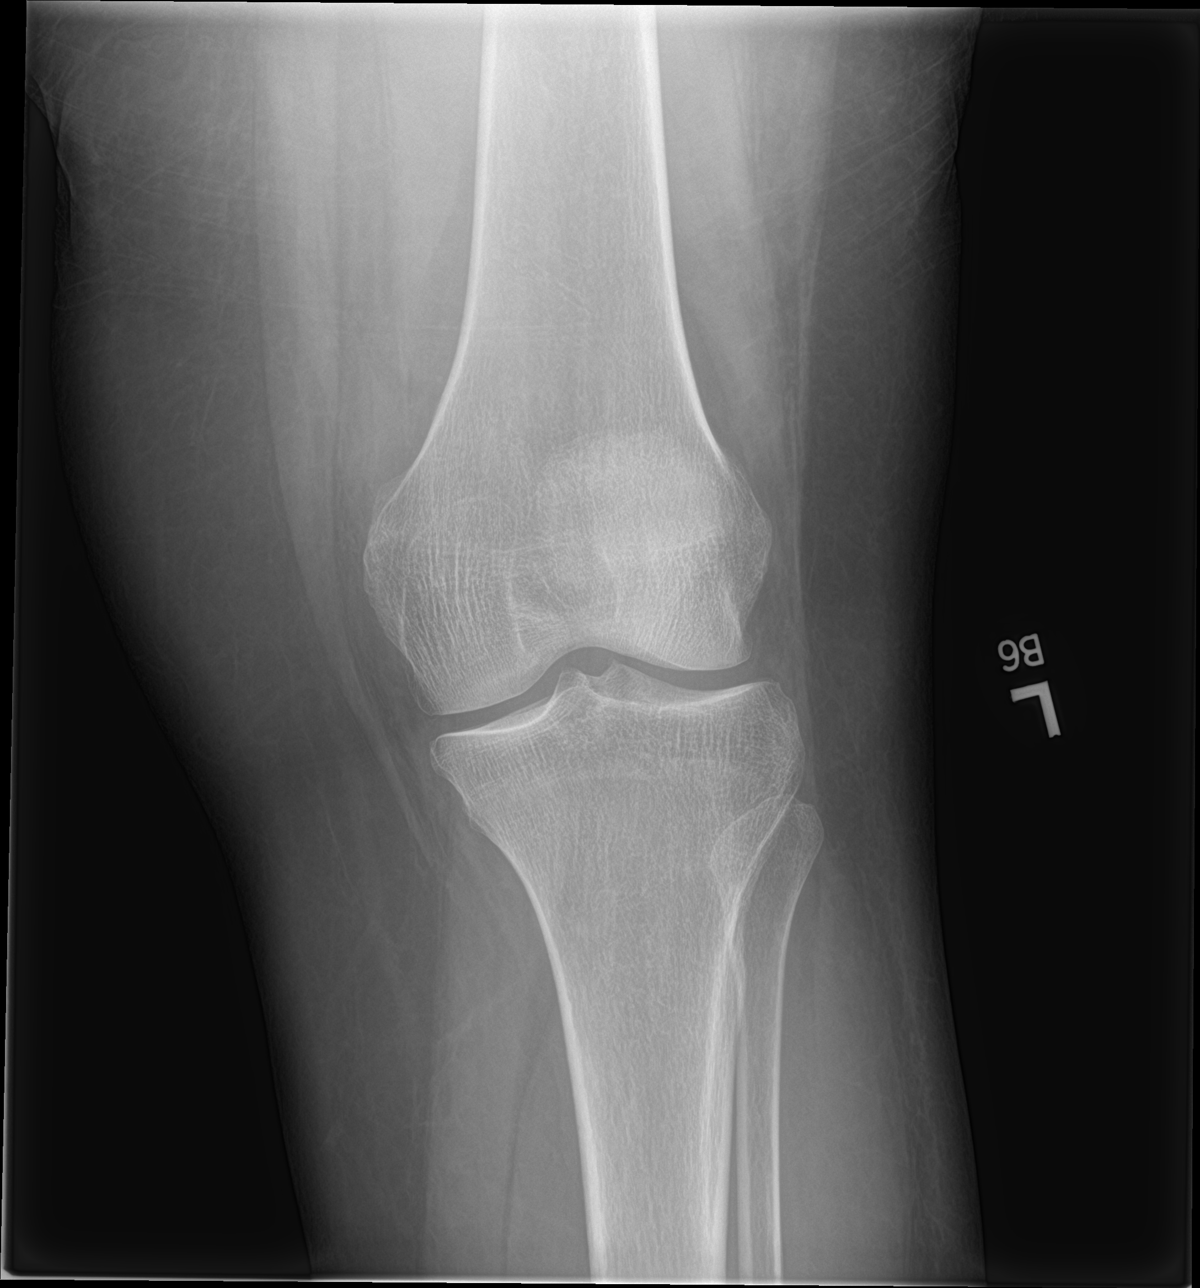

[knee obl (1 of 2)]
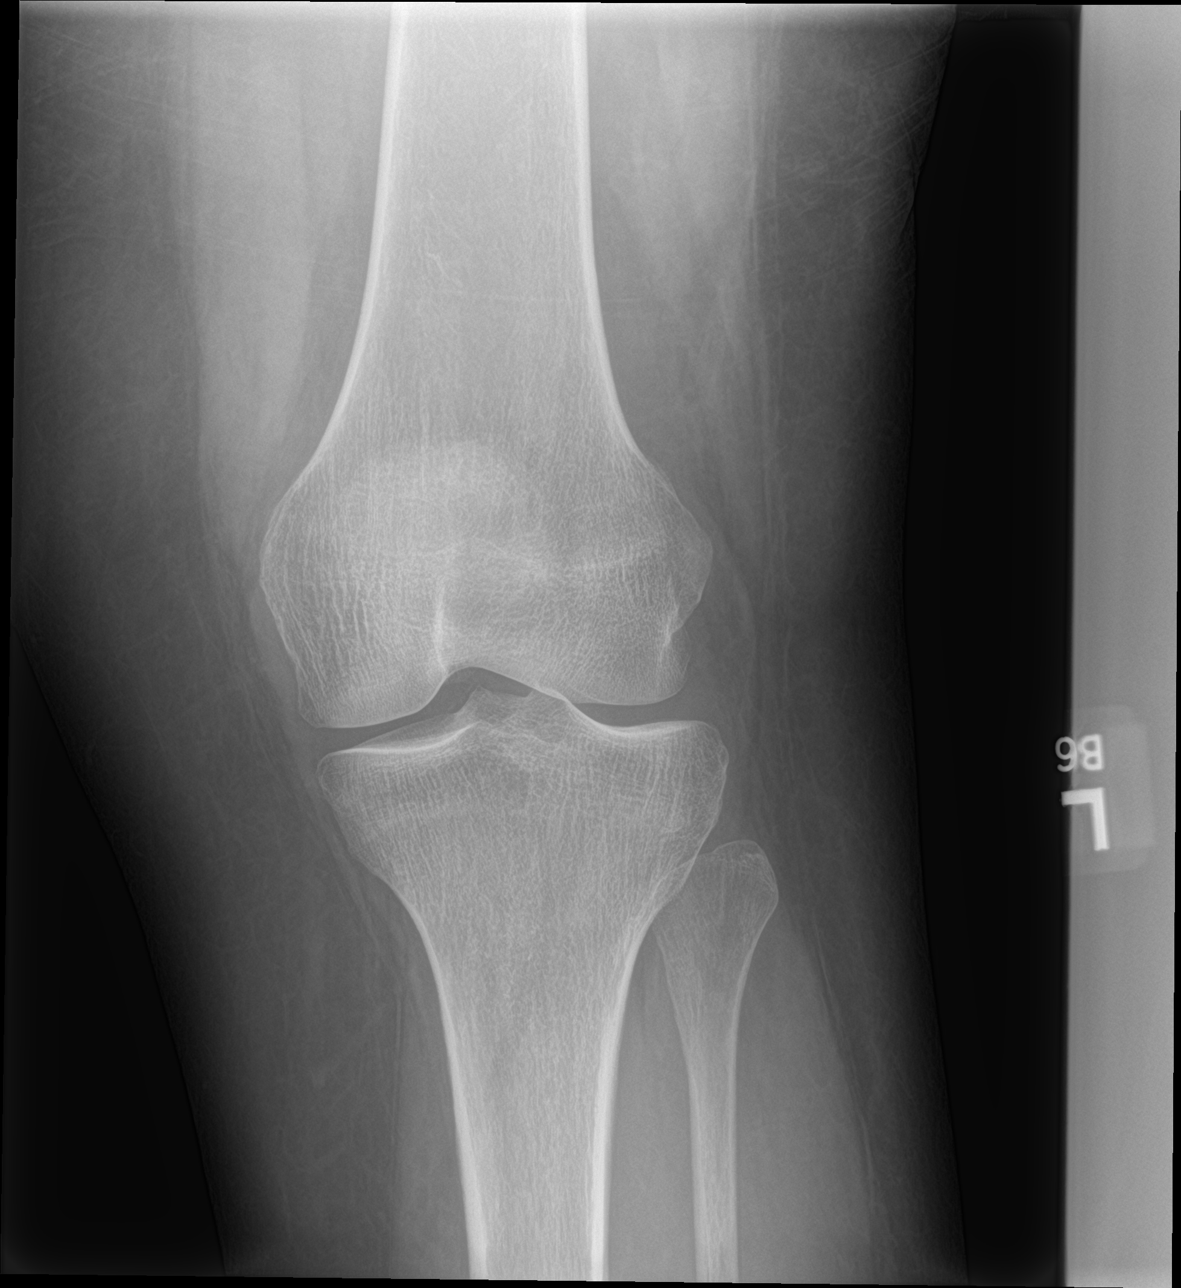

[knee obl (2 of 2)]
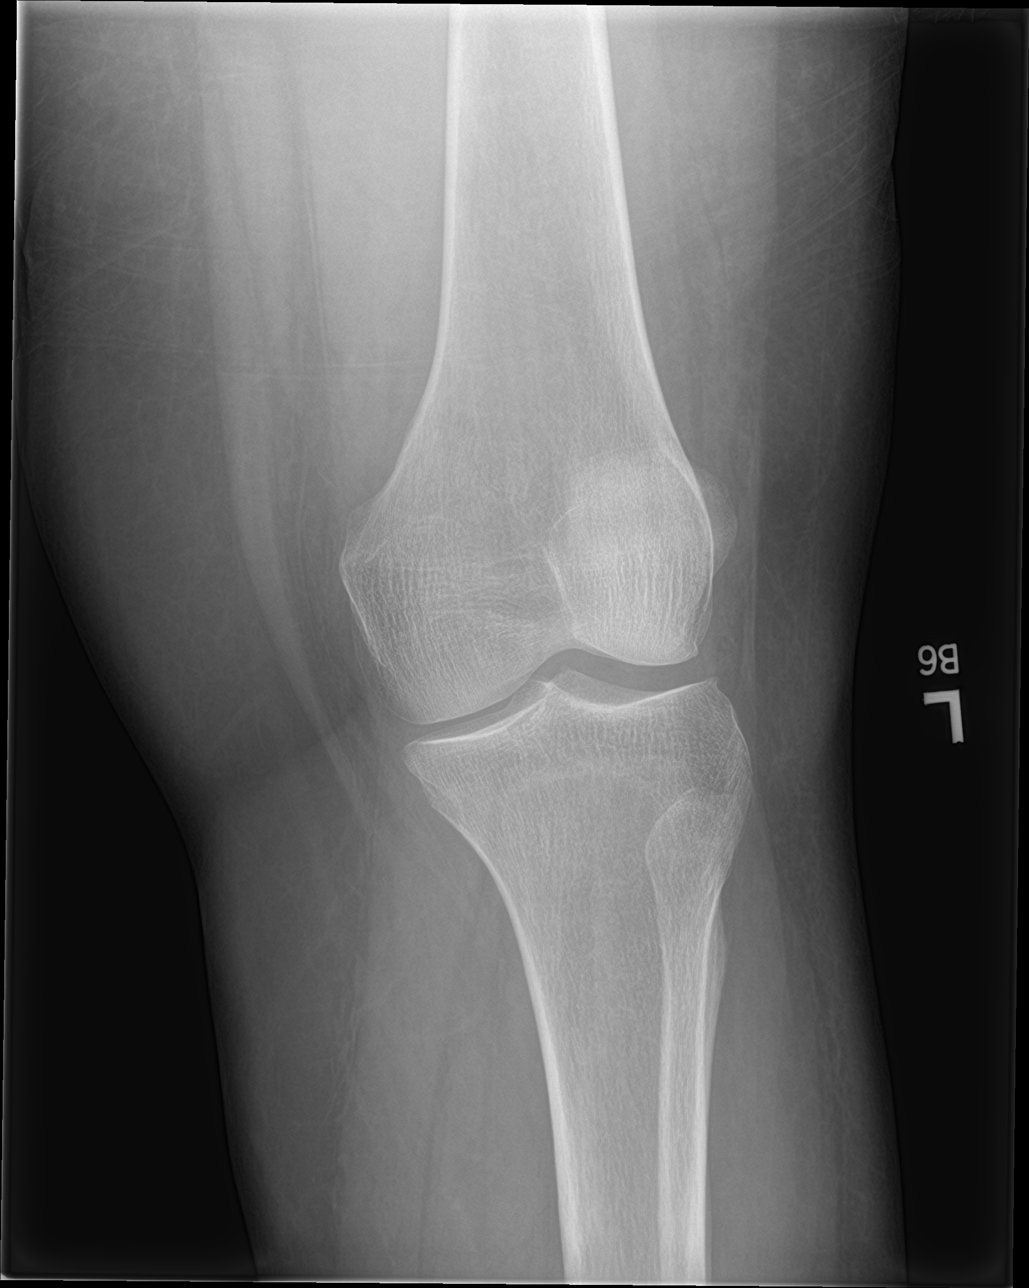

[knee lat]
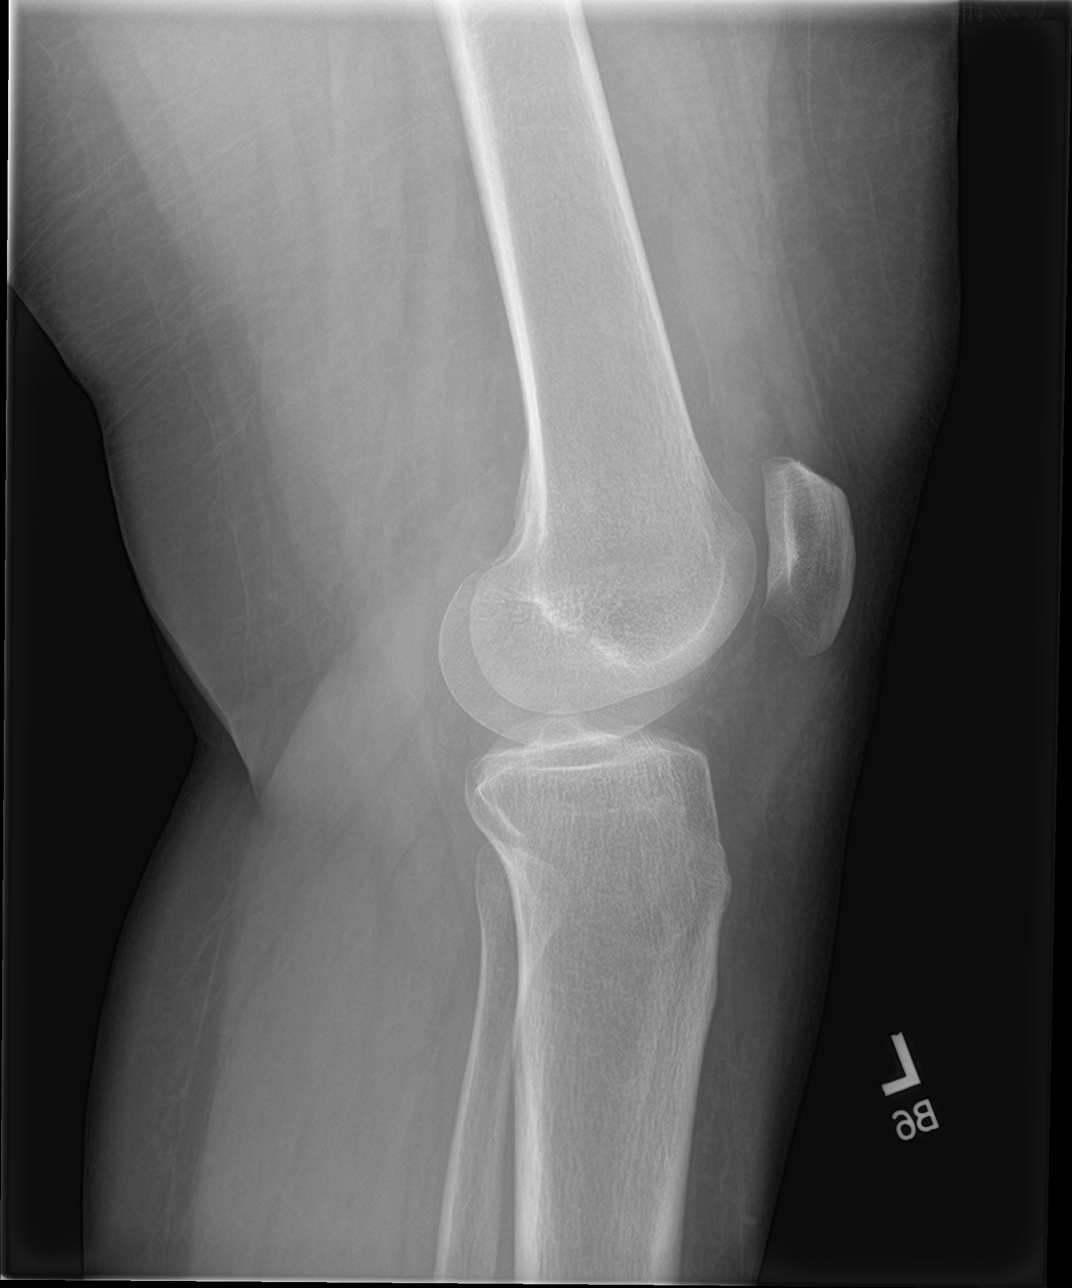

[4 of 4 positions shown; findings below may reference images not displayed]

FINDINGS: Preserved joint spaces and alignment. Possible small suprapatellar
joint effusion. Patella intact. Normal bone mineralization and no
osseous abnormality identified.
IMPRESSION: Possible small joint effusion but otherwise negative radiographic
appearance of the left knee.

## 2020-07-07 ENCOUNTER — Ambulatory Visit: Payer: Self-pay

## 2021-02-20 ENCOUNTER — Other Ambulatory Visit: Payer: Self-pay

## 2021-02-27 ENCOUNTER — Ambulatory Visit (LOCAL_COMMUNITY_HEALTH_CENTER): Payer: Self-pay

## 2021-02-27 ENCOUNTER — Other Ambulatory Visit: Payer: Self-pay

## 2021-02-27 DIAGNOSIS — Z111 Encounter for screening for respiratory tuberculosis: Secondary | ICD-10-CM

## 2021-03-02 ENCOUNTER — Ambulatory Visit (LOCAL_COMMUNITY_HEALTH_CENTER): Payer: Self-pay

## 2021-03-02 ENCOUNTER — Other Ambulatory Visit: Payer: Self-pay

## 2021-03-02 DIAGNOSIS — Z111 Encounter for screening for respiratory tuberculosis: Secondary | ICD-10-CM

## 2021-03-02 LAB — TB SKIN TEST
Induration: 0 mm
TB Skin Test: NEGATIVE

## 2021-03-21 ENCOUNTER — Other Ambulatory Visit: Payer: Self-pay

## 2021-03-21 ENCOUNTER — Emergency Department: Payer: Self-pay

## 2021-03-21 ENCOUNTER — Encounter: Payer: Self-pay | Admitting: Emergency Medicine

## 2021-03-21 ENCOUNTER — Emergency Department
Admission: EM | Admit: 2021-03-21 | Discharge: 2021-03-21 | Disposition: A | Discharge status: Home / Self Care | Payer: Self-pay | Attending: Emergency Medicine | Admitting: Emergency Medicine

## 2021-03-21 DIAGNOSIS — K429 Umbilical hernia without obstruction or gangrene: Secondary | ICD-10-CM | POA: Insufficient documentation

## 2021-03-21 DIAGNOSIS — J00 Acute nasopharyngitis [common cold]: Secondary | ICD-10-CM | POA: Insufficient documentation

## 2021-03-21 LAB — COMPREHENSIVE METABOLIC PANEL
ALT: 19 U/L (ref 0–44)
AST: 18 U/L (ref 15–41)
Albumin: 3.9 g/dL (ref 3.5–5.0)
Alkaline Phosphatase: 113 U/L (ref 38–126)
Anion gap: 8 (ref 5–15)
BUN: 11 mg/dL (ref 6–20)
CO2: 25 mmol/L (ref 22–32)
Calcium: 9.3 mg/dL (ref 8.9–10.3)
Chloride: 107 mmol/L (ref 98–111)
Creatinine, Ser: 0.71 mg/dL (ref 0.44–1.00)
GFR, Estimated: >60 mL/min (ref >60)
Glucose, Bld: 113 mg/dL — ABNORMAL HIGH (ref 70–99)
Potassium: 4.1 mmol/L (ref 3.5–5.1)
Sodium: 140 mmol/L (ref 135–145)
Total Bilirubin: 0.5 mg/dL (ref 0.3–1.2)
Total Protein: 7.6 g/dL (ref 6.5–8.1)

## 2021-03-21 LAB — CBC
HCT: 46.8 % — ABNORMAL HIGH (ref 36.0–46.0)
Hemoglobin: 15.1 g/dL — ABNORMAL HIGH (ref 12.0–15.0)
MCH: 28.9 pg (ref 26.0–34.0)
MCHC: 32.3 g/dL (ref 30.0–36.0)
MCV: 89.7 fL (ref 80.0–100.0)
Platelets: 250 10*3/uL (ref 150–400)
RBC: 5.22 MIL/uL — ABNORMAL HIGH (ref 3.87–5.11)
RDW: 14 % (ref 11.5–15.5)
WBC: 9.8 10*3/uL (ref 4.0–10.5)
nRBC: 0 % (ref 0.0–0.2)

## 2021-03-21 LAB — URINALYSIS, ROUTINE W REFLEX MICROSCOPIC
Bilirubin Urine: NEGATIVE
Glucose, UA: NEGATIVE mg/dL
Ketones, ur: NEGATIVE mg/dL
Leukocytes,Ua: NEGATIVE
Nitrite: NEGATIVE
Protein, ur: NEGATIVE mg/dL
Specific Gravity, Urine: 1.015 (ref 1.005–1.030)
pH: 5.5 (ref 5.0–8.0)

## 2021-03-21 LAB — URINALYSIS, MICROSCOPIC (REFLEX)

## 2021-03-21 LAB — PREGNANCY, URINE: Preg Test, Ur: NEGATIVE

## 2021-03-21 LAB — LIPASE, BLOOD: Lipase: 55 U/L — ABNORMAL HIGH (ref 11–51)

## 2021-03-21 MED ORDER — IOHEXOL 300 MG/ML  SOLN
100.0000 mL | Freq: Once | INTRAMUSCULAR | Status: AC | PRN
  Administered 2021-03-21: 100 mL via INTRAVENOUS

## 2021-03-21 MED ORDER — FENTANYL CITRATE PF 50 MCG/ML IJ SOSY
50.0000 ug | PREFILLED_SYRINGE | Freq: Once | INTRAMUSCULAR | Status: AC
  Administered 2021-03-21: 50 ug via INTRAVENOUS
  Filled 2021-03-21: qty 1

## 2021-03-21 MED ORDER — OXYCODONE HCL 5 MG PO TABS: 5.0000 mg | ORAL_TABLET | Freq: Four times a day (QID) | ORAL | 0 refills | Status: AC | PRN

## 2021-03-21 NOTE — ED Triage Notes (Signed)
Pt via POV from home. Pt c/o lower abd pain that radiates to her back that started yesterday, pt noted to have a umbilical hernia that she has not been dx with. States that it hurts her when she coughs. Denies NVD.  Pt A&OX4 and NAD.

## 2021-03-21 NOTE — ED Notes (Signed)
Signature pad not available at this time. Pt given Dc instructions and follow up instructions. Pt verbalized understanding and denies any questions at this time.

## 2021-03-21 NOTE — Discharge Instructions (Addendum)
Call surgery to make a follow-up to discuss if you need to have surgery.  Take Tylenol 1 g every 8 hours and ibuprofen 600 every 6 hours for pain.  If develop worsening pain continue oxycodone for breakthrough pain but do not drive or work on this   IMPRESSION: 1. Small fat containing umbilical hernia associated with mild inflammatory changes. No bowel enters the hernia. 2. No other evidence of an acute abnormality. 3. Left colon diverticula without evidence of diverticulitis. 4. Aortic atherosclerosis.  Take oxycodone as prescribed. Do not drink alcohol, drive or participate in any other potentially dangerous activities while taking this medication as it may make you sleepy. Do not take this medication with any other sedating medications, either prescription or over-the-counter. If you were prescribed Percocet or Vicodin, do not take these with acetaminophen (Tylenol) as it is already contained within these medications.  This medication is an opiate (or narcotic) pain medication and can be habit forming. Use it as little as possible to achieve adequate pain control. Do not use or use it with extreme caution if you have a history of opiate abuse or dependence. If you are on a pain contract with your primary care doctor or a pain specialist, be sure to let them know you were prescribed this medication today from the Emergency Department. This medication is intended for your use only - do not give any to anyone else and keep it in a secure place where nobody else, especially children, have access to it.

## 2021-03-21 NOTE — ED Provider Notes (Signed)
[facility] Provider Note    None    (approximate)   History   Abdominal Pain   HPI  Kelsey Sampson is a 51 y.o. female  with history of hernia as baby requiring repair and now does not have umbilicus who comes in with abdominal pain. Pt reports  having a cold and coughing and had really bad abdomen pain in December. This went away and she was able to tolerate.  Then yesterday had severe onset of abdomen pain again and bulg in her abdomen.   No vomiting. No fevers. No burning when she pees.  Has not taken anything for pain. NPO this morning.    Physical Exam   Triage Vital Signs: ED Triage Vitals  Enc Vitals Group     BP 03/21/21 0734 (!) 173/108     Pulse Rate 03/21/21 0734 92     Resp 03/21/21 0734 20     Temp 03/21/21 0734 98.1 F (36.7 C)     Temp Source 03/21/21 0734 Oral     SpO2 03/21/21 0734 100 %     Weight 03/21/21 0732 290 lb (131.5 kg)     Height 03/21/21 0732 5\' 9"  (1.753 m)     Head Circumference --      Peak Flow --      Pain Score 03/21/21 0732 10     Pain Loc --      Pain Edu? --      Excl. in GC? --     Most recent vital signs: Vitals:   03/21/21 0734  BP: (!) 173/108  Pulse: 92  Resp: 20  Temp: 98.1 F (36.7 C)  SpO2: 100%     General: Awake, no distress. Appears uncomfortable  CV:  Good peripheral perfusion.  Resp:  Normal effort.  Abd:  No distention. Bulg noted peri umbilical  Other:  No swelling    ED Results / Procedures / Treatments   Labs (all labs ordered are listed, but only abnormal results are displayed) Labs Reviewed  LIPASE, BLOOD - Abnormal; Notable for the following components:      Result Value   Lipase 55 (*)    All other components within normal limits  COMPREHENSIVE METABOLIC PANEL - Abnormal; Notable for the following components:   Glucose, Bld 113 (*)    All other components within normal limits  CBC  URINALYSIS, ROUTINE W REFLEX MICROSCOPIC  POC URINE PREG, ED       RADIOLOGY I have reviewed the CT personally and agree with radiology read and called rads to confirm only small fat containing umbilical hernia    PROCEDURES:  Critical Care performed: No  Procedures   MEDICATIONS ORDERED IN ED: Medications - No data to display   IMPRESSION / MDM / ASSESSMENT AND PLAN / ED COURSE  I reviewed the triage vital signs and the nursing notes.                              Pt seen in triage initially due to no rooms available... Pt with history of hernia repair as baby now coming in with bulg in abdomen and abdominal pain.  Differential diagnosis includes, but is not limited to, hernia, obstruction, perforation, strangulation or other acute pathology. Will given 50 IV fentanyl and get CT to further evaluate.   CMP normal kidneys Lipase slightly elevated does not sound like pancreatitis CBC normal white count. No anemia.  CT with hernia with inflammatory changes of fat   IMPRESSION: 1. Small fat containing umbilical hernia associated with mild inflammatory changes. No bowel enters the hernia. 2. No other evidence of an acute abnormality. 3. Left colon diverticula without evidence of diverticulitis. 4. Aortic atherosclerosis.  I considered admission given patient's pain but given the CT does not have any evidence of obstruction I feel it is reasonable for patient to go home  Pt pain much better. D/w Dr. Claudine Mouton given im concern about some fat necrosis given pain. Recommends pain meds and f/U outpt.  Pt tolerating PO and feels comfortable with plan  We discussed avoiding driving and working on pain meds and using some MiraLAX to prevent constipation  I discussed the provisional nature of ED diagnosis, the treatment so far, the ongoing plan of care, follow up appointments and return precautions with the patient and any family or support people present. They expressed understanding and agreed with the plan, discharged home.      FINAL  CLINICAL IMPRESSION(S) / ED DIAGNOSES   Final diagnoses:  Umbilical hernia without obstruction and without gangrene     Rx / DC Orders   ED Discharge Orders          Ordered    oxyCODONE (ROXICODONE) 5 MG immediate release tablet  Every 6 hours PRN        03/21/21 1039             Note:  This document was prepared using Dragon voice recognition software and may include unintentional dictation errors.   Concha Se, MD 03/21/21 1041

## 2021-03-21 NOTE — ED Notes (Signed)
Urine sent to lab 

## 2021-03-23 ENCOUNTER — Telehealth: Payer: Self-pay

## 2021-03-23 NOTE — Telephone Encounter (Signed)
Left message for the patient to call back to see about scheduling an appointment with Dr Claudine Mouton to be seen for hernia. Was seen in the ER for this.

## 2021-03-26 ENCOUNTER — Encounter: Payer: Self-pay | Admitting: Surgery

## 2021-03-26 ENCOUNTER — Telehealth: Payer: Self-pay | Admitting: Surgery

## 2021-03-26 ENCOUNTER — Ambulatory Visit: Payer: Self-pay | Admitting: Surgery

## 2021-03-26 ENCOUNTER — Other Ambulatory Visit: Payer: Self-pay

## 2021-03-26 DIAGNOSIS — K436 Other and unspecified ventral hernia with obstruction, without gangrene: Secondary | ICD-10-CM | POA: Insufficient documentation

## 2021-03-26 DIAGNOSIS — F17200 Nicotine dependence, unspecified, uncomplicated: Secondary | ICD-10-CM

## 2021-03-26 HISTORY — DX: Nicotine dependence, unspecified, uncomplicated: F17.200

## 2021-03-26 NOTE — Progress Notes (Signed)
Patient ID: Kelsey Sampson, female   DOB: 1970-04-15, 51 y.o.   MRN: [id number]  Chief Complaint: Ventral hernia  History of Present Illness Kelsey Sampson is a 51 y.o. female with a prior history of umbilical hernia repair as a child.  Redeveloped acute abdominal pain and was seen, evaluated and CT scan confirmed a fat filled supraumbilical abdominal wall hernia, whose "base" measures 1.8 cm.  No bowel involvement.  She reports a bulge that is tender on palpation.  She denies any vomiting, fevers and chills.  She reports some nausea, with normal bowel movements. She is a smoker and pain is exacerbated with her cough.  Desires to quit smoking, and feels it would not be an issue preop.  We also discussed other optimization including weight loss and she is motivated, but I do not believe we should defer her surgery until this happens.  Past Medical History Past Medical History:  Diagnosis Date   Abscess of buttock, left       Past Surgical History:  Procedure Laterality Date   ABDOMINAL HYSTERECTOMY      No Known Allergies  Current Outpatient Medications  Medication Sig Dispense Refill   oxyCODONE (ROXICODONE) 5 MG immediate release tablet Take 1 tablet (5 mg total) by mouth every 6 (six) hours as needed for up to 5 days. 20 tablet 0   No current facility-administered medications for this visit.    Family History History reviewed. No pertinent family history.    Social History Social History   Tobacco Use   Smoking status: Every Day    Types: Cigarettes   Smokeless tobacco: Never  Substance Use Topics   Alcohol use: No   Drug use: Never        Review of Systems  Constitutional: Negative.   HENT: Negative.    Eyes: Negative.   Respiratory: Negative.    Cardiovascular: Negative.   Gastrointestinal:  Positive for abdominal pain and nausea.  Genitourinary:  Positive for frequency.  Skin: Negative.   Neurological: Negative.   Psychiatric/Behavioral: Negative.        Physical Exam Blood pressure (!) 145/88, pulse 89, temperature 98.7 F (37.1 C), temperature source Oral, height 5\' 9"  (1.753 m), weight 274 lb 9.6 oz (124.6 kg). Last Weight  Most recent update: 03/26/2021  9:06 AM    Weight  124.6 kg (274 lb 9.6 oz)             CONSTITUTIONAL: Well developed, and nourished, appropriately responsive and aware without distress.   EYES: Sclera non-icteric.   EARS, NOSE, MOUTH AND THROAT: Mask worn.   The oropharynx is clear. Oral mucosa is pink and moist.   Hearing is intact to voice.  NECK: Trachea is midline, and there is no jugular venous distension.  LYMPH NODES:  Lymph nodes in the neck are not enlarged. RESPIRATORY:  Lungs are clear, and breath sounds are equal bilaterally. Normal respiratory effort without pathologic use of accessory muscles. CARDIOVASCULAR: Heart is regular in rate and rhythm. GI: The abdomen is notable for a supraumbilical mass that is tender to touch, nonreducible.  Her abdomen is otherwise soft, nontender, and nondistended. There were no other palpable masses. I did not appreciate hepatosplenomegaly. There were normal bowel sounds. MUSCULOSKELETAL:  Symmetrical muscle tone appreciated in all four extremities.    SKIN: Skin turgor is normal. No pathologic skin lesions appreciated.  NEUROLOGIC:  Motor and sensation appear grossly normal.  Cranial nerves are grossly without defect. PSYCH:  Alert and oriented  to person, place and time. Affect is appropriate for situation.  Data Reviewed I have personally reviewed what is currently available of the patient's imaging, recent labs and medical records.   Labs:  CBC Latest Ref Rng & Units 03/21/2021 12/06/2013 05/02/2013  WBC 4.0 - 10.5 K/uL 9.8 12.8(H) 14.2(H)  Hemoglobin 12.0 - 15.0 g/dL 15.1(H) 14.1 10.9(L)  Hematocrit 36.0 - 46.0 % 46.8(H) 44.0 34.5(L)  Platelets 150 - 400 K/uL 250 239 333   CMP Latest Ref Rng & Units 03/21/2021 12/06/2013 04/30/2013  Glucose 70 - 99 mg/dL 113(H)  105(H) -  BUN 6 - 20 mg/dL 11 5(L) -  Creatinine 0.44 - 1.00 mg/dL 0.71 0.85 0.71  Sodium 135 - 145 mmol/L 140 141 -  Potassium 3.5 - 5.1 mmol/L 4.1 4.0 -  Chloride 98 - 111 mmol/L 107 109(H) -  CO2 22 - 32 mmol/L 25 26 -  Calcium 8.9 - 10.3 mg/dL 9.3 8.6 -  Total Protein 6.5 - 8.1 g/dL 7.6 8.0 -  Total Bilirubin 0.3 - 1.2 mg/dL 0.5 0.4 -  Alkaline Phos 38 - 126 U/L 113 109 -  AST 15 - 41 U/L 18 13(L) -  ALT 0 - 44 U/L 19 16 -      Imaging: Radiology review:  ADDENDUM REPORT: 03/21/2021 10:38   ADDENDUM: The midline hernia is actually supraumbilical, lying 3 cm above the umbilicus. This was not present on the prior CT.     Electronically Signed   By: Lajean Manes M.D.   On: 03/21/2021 10:38    Addended by Lajean Manes, MD on 03/21/2021 10:40 AM   Study Result  Narrative & Impression  CLINICAL DATA:  Triage note: Pt via POV from home. Pt c/o lower abd pain that radiates to her back that started yesterday, pt noted to have a umbilical hernia that she has not been dx with. States that it hurts her when she coughs. Denies NVD. Pt A&OX4 and NAD.   EXAM: CT ABDOMEN AND PELVIS WITH CONTRAST   TECHNIQUE: Multidetector CT imaging of the abdomen and pelvis was performed using the standard protocol following bolus administration of intravenous contrast.   RADIATION DOSE REDUCTION: This exam was performed according to the departmental dose-optimization program which includes automated exposure control, adjustment of the mA and/or kV according to patient size and/or use of iterative reconstruction technique.   CONTRAST:  198mL OMNIPAQUE IOHEXOL 300 MG/ML  SOLN   COMPARISON:  04/28/2013   FINDINGS: Lower chest: No acute abnormality.   Hepatobiliary: Liver normal in size and overall attenuation. There several low-density liver lesions, largest in the posterior aspect of segment 2, 1.8 cm, all consistent with cysts. No other liver lesions. Gallbladder is distended.  There is hypoattenuating material, higher in attenuation than fluid, within the gallbladder, consistent with sludge. No radiopaque stones. No wall thickening or inflammation. No bile duct dilation.   Pancreas: Unremarkable. No pancreatic ductal dilatation or surrounding inflammatory changes.   Spleen: Normal in size without focal abnormality.   Adrenals/Urinary Tract: Adrenal glands are unremarkable. Kidneys are normal, without renal calculi, focal lesion, or hydronephrosis. Bladder is unremarkable.   Stomach/Bowel: Normal stomach. Small bowel and colon are normal in caliber. No wall thickening or inflammation. There is fat attenuation along the wall of the colon, nonspecific, finding that can be seen as result of remote inflammation. There are scattered left colon diverticula without diverticulitis. Normal appendix visualized.   Vascular/Lymphatic: Mild aortic atherosclerosis. No aneurysm. No enlarged lymph nodes.   Reproductive:  Uterus and bilateral adnexa are unremarkable.   Other: Fat containing umbilical hernia base measuring 1.8 cm. No bowel enters this. Herniated fat is surrounded by a rim of increased attenuation and mild haziness in the adjacent subcutaneous fat suggesting inflammation.   No ascites.   Musculoskeletal: No fracture or acute finding.  No bone lesion.   IMPRESSION: 1. Small fat containing umbilical hernia associated with mild inflammatory changes. No bowel enters the hernia. 2. No other evidence of an acute abnormality. 3. Left colon diverticula without evidence of diverticulitis. 4. Aortic atherosclerosis.   Electronically Signed: By: Lajean Manes M.D. On: 03/21/2021 10:14   Within last 24 hrs: No results found.  Assessment     Patient Active Problem List   Diagnosis Date Noted   Morbid obesity ([place]) 03/26/2021   Incarcerated ventral hernia 03/26/2021   Tobacco use disorder 03/26/2021   Abscess of buttock, left     Plan    We  discussed smoking cessation and she desires to pursue this prior to proceeding with surgery.  We also discussed weight loss, which she appears readily interested in.  We will have her follow-up in a month, anticipating scheduling surgery shortly thereafter.  Face-to-face time spent with the patient and accompanying care providers(if present) was 30 minutes, with more than 50% of the time spent counseling, educating, and coordinating care of the patient.    These notes generated with voice recognition software. I apologize for typographical errors.  Ronny Bacon M.D., FACS 03/26/2021, 9:57 AM

## 2021-03-26 NOTE — Patient Instructions (Addendum)
If you have any concerns or questions, please feel free to call our office. See follow up appointment below.   Umbilical Hernia, Adult A hernia is a bulge of tissue that pushes through an opening between muscles. An umbilical hernia happens in the abdomen, near the belly button (umbilicus). The hernia may contain tissues from the small intestine, large intestine, or fatty tissue covering the intestines. Umbilical hernias in adults tend to get worse over time, and they require surgical treatment. There are different types of umbilical hernias, including: Indirect hernia. This type is located just above or below the umbilicus. It is the most common type of umbilical hernia in adults. Direct hernia. This type forms through an opening formed by the umbilicus. Reducible hernia. This type of hernia comes and goes. It may be visible only when you strain, lift something heavy, or cough. This type of hernia can be pushed back into the abdomen (reduced). Incarcerated hernia. This type traps abdominal tissue inside the hernia. This type of hernia cannot be reduced. Strangulated hernia. This type of hernia cuts off blood flow to the tissues inside the hernia. The tissues can start to die if this happens. This type of hernia requires emergency treatment. What are the causes? An umbilical hernia happens when tissue inside the abdomen presses on a weak area of the abdominal muscles. What increases the risk? You may have a greater risk of this condition if you: Are obese. Have had several pregnancies. Have a buildup of fluid inside your abdomen. Have had surgery that weakens the abdominal muscles. What are the signs or symptoms? The main symptom of this condition is a painless bulge at or near the belly button. A reducible hernia may be visible only when you strain, lift something heavy, or cough. Other symptoms may include: Dull pain. A feeling of pressure. Symptoms of a strangulated hernia may  include: Pain that gets increasingly worse. Nausea and vomiting. Pain when pressing on the hernia. Skin over the hernia becoming red or purple. Constipation. Blood in the stool. How is this diagnosed? This condition may be diagnosed based on: A physical exam. You may be asked to cough or strain while standing. These actions increase the pressure inside your abdomen and can force the hernia through the opening in your muscles. Your health care provider may try to reduce the hernia by pressing on it. Your symptoms and medical history. How is this treated? Surgery is the only treatment for an umbilical hernia. Surgery for a strangulated hernia is done as soon as possible. If you have a small hernia that is not incarcerated, you may need to lose weight before having surgery. Follow these instructions at home: Lose weight, if told by your health care provider. Do not try to push the hernia back in. Watch your hernia for any changes in color or size. Tell your health care provider if any changes occur. You may need to avoid activities that increase pressure on your hernia. Do not lift anything that is heavier than 10 lb (4.5 kg), or the limit that you are told, until your health care provider says that it is safe. Take over-the-counter and prescription medicines only as told by your health care provider. Keep all follow-up visits. This is important. Contact a health care provider if: Your hernia gets larger. Your hernia becomes painful. Get help right away if: You develop sudden, severe pain near the area of your hernia. You have pain as well as nausea or vomiting. You have pain and  the skin over your hernia changes color. You develop a fever or chills. Summary A hernia is a bulge of tissue that pushes through an opening between muscles. An umbilical hernia happens near the belly button. Surgery is the only treatment for an umbilical hernia. Do not try to push your hernia back in. Keep all  follow-up visits. This is important. This information is not intended to replace advice given to you by your health care provider. Make sure you discuss any questions you have with your health care provider.  Calorie Counting for Weight Loss Calories are units of energy. Your body needs a certain number of calories from food to keep going throughout the day. When you eat or drink more calories than your body needs, your body stores the extra calories mostly as fat. When you eat or drink fewer calories than your body needs, your body burns fat to get the energy it needs. Calorie counting means keeping track of how many calories you eat and drink each day. Calorie counting can be helpful if you need to lose weight. If you eat fewer calories than your body needs, you should lose weight. Ask your health care provider what a healthy weight is for you. For calorie counting to work, you will need to eat the right number of calories each day to lose a healthy amount of weight per week. A dietitian can help you figure out how many calories you need in a day and will suggest ways to reach your calorie goal. A healthy amount of weight to lose each week is usually 1-2 lb (0.5-0.9 kg). This usually means that your daily calorie intake should be reduced by 500-750 calories. Eating 1,200-1,500 calories a day can help most women lose weight. Eating 1,500-1,800 calories a day can help most men lose weight. What do I need to know about calorie counting? Work with your health care provider or dietitian to determine how many calories you should get each day. To meet your daily calorie goal, you will need to: Find out how many calories are in each food that you would like to eat. Try to do this before you eat. Decide how much of the food you plan to eat. Keep a food log. Do this by writing down what you ate and how many calories it had. To successfully lose weight, it is important to balance calorie counting with a  healthy lifestyle that includes regular activity. Where do I find calorie information? The number of calories in a food can be found on a Nutrition Facts label. If a food does not have a Nutrition Facts label, try to look up the calories online or ask your dietitian for help. Remember that calories are listed per serving. If you choose to have more than one serving of a food, you will have to multiply the calories per serving by the number of servings you plan to eat. For example, the label on a package of bread might say that a serving size is 1 slice and that there are 90 calories in a serving. If you eat 1 slice, you will have eaten 90 calories. If you eat 2 slices, you will have eaten 180 calories. How do I keep a food log? After each time that you eat, record the following in your food log as soon as possible: What you ate. Be sure to include toppings, sauces, and other extras on the food. How much you ate. This can be measured in cups, ounces, or number  of items. How many calories were in each food and drink. The total number of calories in the food you ate. Keep your food log near you, such as in a pocket-sized notebook or on an app or website on your mobile phone. Some programs will calculate calories for you and show you how many calories you have left to meet your daily goal. What are some portion-control tips? Know how many calories are in a serving. This will help you know how many servings you can have of a certain food. Use a measuring cup to measure serving sizes. You could also try weighing out portions on a kitchen scale. With time, you will be able to estimate serving sizes for some foods. Take time to put servings of different foods on your favorite plates or in your favorite bowls and cups so you know what a serving looks like. Try not to eat straight from a food's packaging, such as from a bag or box. Eating straight from the package makes it hard to see how much you are eating  and can lead to overeating. Put the amount you would like to eat in a cup or on a plate to make sure you are eating the right portion. Use smaller plates, glasses, and bowls for smaller portions and to prevent overeating. Try not to multitask. For example, avoid watching TV or using your computer while eating. If it is time to eat, sit down at a table and enjoy your food. This will help you recognize when you are full. It will also help you be more mindful of what and how much you are eating. What are tips for following this plan? Reading food labels Check the calorie count compared with the serving size. The serving size may be smaller than what you are used to eating. Check the source of the calories. Try to choose foods that are high in protein, fiber, and vitamins, and low in saturated fat, trans fat, and sodium. Shopping Read nutrition labels while you shop. This will help you make healthy decisions about which foods to buy. Pay attention to nutrition labels for low-fat or fat-free foods. These foods sometimes have the same number of calories or more calories than the full-fat versions. They also often have added sugar, starch, or salt to make up for flavor that was removed with the fat. Make a grocery list of lower-calorie foods and stick to it. Cooking Try to cook your favorite foods in a healthier way. For example, try baking instead of frying. Use low-fat dairy products. Meal planning Use more fruits and vegetables. One-half of your plate should be fruits and vegetables. Include lean proteins, such as chicken, [place], and fish. Lifestyle Each week, aim to do one of the following: 150 minutes of moderate exercise, such as walking. 75 minutes of vigorous exercise, such as running. General information Know how many calories are in the foods you eat most often. This will help you calculate calorie counts faster. Find a way of tracking calories that works for you. Get creative. Try  different apps or programs if writing down calories does not work for you. What foods should I eat?  Eat nutritious foods. It is better to have a nutritious, high-calorie food, such as an avocado, than a food with few nutrients, such as a bag of potato chips. Use your calories on foods and drinks that will fill you up and will not leave you hungry soon after eating. Examples of foods that fill you up  are nuts and nut butters, vegetables, lean proteins, and high-fiber foods such as whole grains. High-fiber foods are foods with more than 5 g of fiber per serving. Pay attention to calories in drinks. Low-calorie drinks include water and unsweetened drinks. The items listed above may not be a complete list of foods and beverages you can eat. Contact a dietitian for more information. What foods should I limit? Limit foods or drinks that are not good sources of vitamins, minerals, or protein or that are high in unhealthy fats. These include: Candy. Other sweets. Sodas, specialty coffee drinks, alcohol, and juice. The items listed above may not be a complete list of foods and beverages you should avoid. Contact a dietitian for more information. How do I count calories when eating out? Pay attention to portions. Often, portions are much larger when eating out. Try these tips to keep portions smaller: Consider sharing a meal instead of getting your own. If you get your own meal, eat only half of it. Before you start eating, ask for a container and put half of your meal into it. When available, consider ordering smaller portions from the menu instead of full portions. Pay attention to your food and drink choices. Knowing the way food is cooked and what is included with the meal can help you eat fewer calories. If calories are listed on the menu, choose the lower-calorie options. Choose dishes that include vegetables, fruits, whole grains, low-fat dairy products, and lean proteins. Choose items that are  boiled, broiled, grilled, or steamed. Avoid items that are buttered, battered, fried, or served with cream sauce. Items labeled as crispy are usually fried, unless stated otherwise. Choose water, low-fat milk, unsweetened iced tea, or other drinks without added sugar. If you want an alcoholic beverage, choose a lower-calorie option, such as a glass of wine or light beer. Ask for dressings, sauces, and syrups on the side. These are usually high in calories, so you should limit the amount you eat. If you want a salad, choose a garden salad and ask for grilled meats. Avoid extra toppings such as bacon, cheese, or fried items. Ask for the dressing on the side, or ask for olive oil and vinegar or lemon to use as dressing. Estimate how many servings of a food you are given. Knowing serving sizes will help you be aware of how much food you are eating at restaurants. Where to find more information Centers for Disease Control and Prevention: FootballExhibition.com.br U.S. Department of Agriculture: WrestlingReporter.dk Summary Calorie counting means keeping track of how many calories you eat and drink each day. If you eat fewer calories than your body needs, you should lose weight. A healthy amount of weight to lose per week is usually 1-2 lb (0.5-0.9 kg). This usually means reducing your daily calorie intake by 500-750 calories. The number of calories in a food can be found on a Nutrition Facts label. If a food does not have a Nutrition Facts label, try to look up the calories online or ask your dietitian for help. Use smaller plates, glasses, and bowls for smaller portions and to prevent overeating. Use your calories on foods and drinks that will fill you up and not leave you hungry shortly after a meal. This information is not intended to replace advice given to you by your health care provider. Make sure you discuss any questions you have with your health care provider. Document Revised: 03/22/2019 Document Reviewed:  03/22/2019 Elsevier Patient Education  2022 [employer].

## 2021-03-26 NOTE — Telephone Encounter (Signed)
Patient has been advised of Pre-Admission date/time, COVID Testing date and Surgery date.  Surgery Date: 05/01/21 Preadmission Testing Date: 04/21/21 (phone 8a-1p) Covid Testing Date: Not needed.     Patient has been made aware to call [phone], between 1-3:00pm the day before surgery, to find out what time to arrive for surgery.

## 2021-04-02 ENCOUNTER — Encounter: Payer: Self-pay | Admitting: Nurse Practitioner

## 2021-04-02 ENCOUNTER — Other Ambulatory Visit: Payer: Self-pay

## 2021-04-02 ENCOUNTER — Ambulatory Visit: Payer: Self-pay | Admitting: Nurse Practitioner

## 2021-04-02 DIAGNOSIS — Z113 Encounter for screening for infections with a predominantly sexual mode of transmission: Secondary | ICD-10-CM

## 2021-04-02 LAB — WET PREP FOR TRICH, YEAST, CLUE
Trichomonas Exam: NEGATIVE
Yeast Exam: NEGATIVE

## 2021-04-02 LAB — HM HIV SCREENING LAB: HM HIV Screening: NEGATIVE

## 2021-04-02 LAB — HM HEPATITIS C SCREENING LAB: HM Hepatitis Screen: NEGATIVE

## 2021-04-02 LAB — HEPATITIS B SURFACE ANTIGEN: Hepatitis B Surface Ag: NONREACTIVE

## 2021-04-02 NOTE — Progress Notes (Signed)
[facility] Department  STI clinic/screening visit [address] [place] [address] [phone]  Subjective:  Kelsey Sampson is a 51 y.o. female being seen today for an STI screening visit. The patient reports they do have symptoms.  Patient reports that they do not desire a pregnancy in the next year.   They reported they are not interested in discussing contraception today.    No LMP recorded. Patient has had a hysterectomy.   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  Patient reports discharge and dysuria for one week.    Last HIV test per patient/review of record was 01/02/2020   Screening for MPX risk: Does the patient have an unexplained rash? No Is the patient MSM? No Does the patient endorse multiple sex partners or anonymous sex partners? No Did the patient have close or sexual contact with a person diagnosed with MPX? No Has the patient traveled outside the [place] where MPX is endemic? No Is there a high clinical suspicion for MPX-- evidenced by one of the following No  -Unlikely to be chickenpox  -Lymphadenopathy  -Rash that present in same phase of evolution on any given body part See flowsheet for further details and programmatic requirements.    The following portions of the patient's history were reviewed and updated as appropriate: allergies, current medications, past medical history, past social history, past surgical history and problem list.  Objective:  There were no vitals filed for this visit.  Physical Exam Constitutional:      Appearance: Normal appearance.  HENT:     Head:  Normocephalic.     Nose: Nose normal.     Mouth/Throat:     Mouth: Mucous membranes are moist.     Comments: Missing detention noted.  Last dental exam 1 year ago.  No visible signs of dental caries.  Eyes:     Pupils: Pupils are equal, round, and reactive to light.  Pulmonary:     Effort: Pulmonary effort is normal.  Abdominal:     General: Abdomen is flat.     Palpations: Abdomen is soft.  Genitourinary:    Comments: External genitalia/pubic area without nits, lice, edema, erythema, lesions and inguinal adenopathy. Vagina with normal mucosa and discharge. pH 4.5. Musculoskeletal:     Cervical back: Full passive range of motion without pain, normal range of motion and neck supple.  Skin:    General: Skin is warm and dry.  Neurological:     Mental Status: She is alert and oriented to person, place, and time.  Psychiatric:        Attention and Perception: Attention normal.        Mood and Affect: Mood normal.        Speech: Speech normal.        Behavior: Behavior is cooperative.     Assessment and Plan:  Kelsey Sampson is a 51 y.o. female presenting to the [facility] Department for STI screening  1. Screening examination for venereal disease -51 year old female in clinic today for STD screening. -Patient accepted all screenings including vaginal  CT/GC and bloodwork for HIV/RPR, HBV/HCV Patient meets criteria for HepB screening? Yes. Ordered? Yes Patient meets criteria for HepC screening? Yes. Ordered? Yes  Treat wet prep per standing order Discussed time line for [facility] results and that patient will be called with positive results and encouraged patient to call if she had not heard in 2 weeks.  Counseled to return or seek care for continued or worsening symptoms Recommended condom use with all sex  Patient is currently using no contraception to prevent pregnancy.  Patient had a hysterectomy.    - WET PREP FOR TRICH, YEAST, CLUE - HIV/HCV [place] [facility] -  HBV Antigen/Antibody [facility] - Syphilis Serology, [place] [facility] - Chlamydia/Gonorrhea [place] [facility]     Return if symptoms worsen or fail to improve.  Future Appointments  Date Time Provider Department Center  04/21/2021  9:45 AM [facility] PAT2 [facility] None  04/23/2021  8:45 AM Campbell Lerner, MD [facility] None    Glenna Fellows, FNP

## 2021-04-02 NOTE — Progress Notes (Signed)
Patient seen today for STD visit. Wet prep reviewed, no tx per standing orders. Condoms declined.

## 2021-04-21 ENCOUNTER — Other Ambulatory Visit: Payer: Self-pay

## 2021-04-21 ENCOUNTER — Encounter (HOSPITAL_COMMUNITY): Payer: Self-pay | Admitting: Urgent Care

## 2021-04-21 ENCOUNTER — Ambulatory Visit: Payer: Self-pay | Admitting: Surgery

## 2021-04-21 ENCOUNTER — Encounter
Admission: RE | Admit: 2021-04-21 | Discharge: 2021-04-21 | Disposition: A | Discharge status: Home / Self Care | Payer: Self-pay | Source: Ambulatory Visit | Attending: Surgery | Admitting: Surgery

## 2021-04-21 VITALS — BP 135/80 | HR 79 | Resp 16 | Ht 69.0 in | Wt 278.0 lb

## 2021-04-21 DIAGNOSIS — F17200 Nicotine dependence, unspecified, uncomplicated: Secondary | ICD-10-CM

## 2021-04-21 DIAGNOSIS — K436 Other and unspecified ventral hernia with obstruction, without gangrene: Secondary | ICD-10-CM

## 2021-04-21 DIAGNOSIS — Z01812 Encounter for preprocedural laboratory examination: Secondary | ICD-10-CM | POA: Insufficient documentation

## 2021-04-21 LAB — CBC WITH DIFFERENTIAL/PLATELET
Abs Immature Granulocytes: 0.02 10*3/uL (ref 0.00–0.07)
Basophils Absolute: 0 10*3/uL (ref 0.0–0.1)
Basophils Relative: 1 %
Eosinophils Absolute: 0.2 10*3/uL (ref 0.0–0.5)
Eosinophils Relative: 2 %
HCT: 44.9 % (ref 36.0–46.0)
Hemoglobin: 14.3 g/dL (ref 12.0–15.0)
Immature Granulocytes: 0 %
Lymphocytes Relative: 33 %
Lymphs Abs: 2.5 10*3/uL (ref 0.7–4.0)
MCH: 28 pg (ref 26.0–34.0)
MCHC: 31.8 g/dL (ref 30.0–36.0)
MCV: 88 fL (ref 80.0–100.0)
Monocytes Absolute: 0.5 10*3/uL (ref 0.1–1.0)
Monocytes Relative: 6 %
Neutro Abs: 4.5 10*3/uL (ref 1.7–7.7)
Neutrophils Relative %: 58 %
Platelets: 241 10*3/uL (ref 150–400)
RBC: 5.1 MIL/uL (ref 3.87–5.11)
RDW: 14.2 % (ref 11.5–15.5)
WBC: 7.6 10*3/uL (ref 4.0–10.5)
nRBC: 0 % (ref 0.0–0.2)

## 2021-04-21 LAB — COMPREHENSIVE METABOLIC PANEL
ALT: 27 U/L (ref 0–44)
AST: 22 U/L (ref 15–41)
Albumin: 4 g/dL (ref 3.5–5.0)
Alkaline Phosphatase: 119 U/L (ref 38–126)
Anion gap: 6 (ref 5–15)
BUN: 12 mg/dL (ref 6–20)
CO2: 27 mmol/L (ref 22–32)
Calcium: 9.2 mg/dL (ref 8.9–10.3)
Chloride: 103 mmol/L (ref 98–111)
Creatinine, Ser: 0.66 mg/dL (ref 0.44–1.00)
GFR, Estimated: >60 mL/min (ref >60)
Glucose, Bld: 103 mg/dL — ABNORMAL HIGH (ref 70–99)
Potassium: 3.7 mmol/L (ref 3.5–5.1)
Sodium: 136 mmol/L (ref 135–145)
Total Bilirubin: 0.6 mg/dL (ref 0.3–1.2)
Total Protein: 7.8 g/dL (ref 6.5–8.1)

## 2021-04-21 NOTE — Patient Instructions (Addendum)
Your procedure is scheduled on: 05/01/21 Report to DAY SURGERY DEPARTMENT LOCATED ON 2ND FLOOR MEDICAL MALL ENTRANCE. To find out your arrival time please call [phone] between 1PM - 3PM on 04/30/21.  Remember: Instructions that are not followed completely may result in serious medical risk, up to and including death, or upon the discretion of your surgeon and anesthesiologist your surgery may need to be rescheduled.     _X__ 1. Do not eat food or drink any liquids after midnight the night before your procedure.                 No gum chewing or hard candies.   __X__2.  On the morning of surgery brush your teeth with toothpaste and water, you                 may rinse your mouth with mouthwash if you wish.  Do not swallow any              toothpaste of mouthwash.     _X__ 3.  No Alcohol for 24 hours before or after surgery.   _X__ 4.  Do Not Smoke or use e-cigarettes For 24 Hours Prior to Your Surgery.                 Do not use any chewable tobacco products for at least 6 hours prior to                 surgery.  ____  5.  Bring all medications with you on the day of surgery if instructed.   __X__  6.  Notify your doctor if there is any change in your medical condition      (cold, fever, infections).     Do not wear jewelry, make-up, hairpins, clips or nail polish. Do not wear lotions, powders, or perfumes.  Do not shave body hair 48 hours prior to surgery. Men may shave face and neck. Do not bring valuables to the hospital.    [facility] is not responsible for any belongings or valuables.  Contacts, dentures/partials or body piercings may not be worn into surgery. Bring a case for your contacts, glasses or hearing aids, a denture cup will be supplied. Leave your suitcase in the car. After surgery it may be brought to your room. For patients admitted to the hospital, discharge time is determined by your treatment team.   Patients discharged the day of surgery will not be allowed  to drive home.   Please read over the following fact sheets that you were given:   MRSA Information, CHG soap  __X__ Take these medicines the morning of surgery with A SIP OF WATER:    1. none  2.   3.   4.  5.  6.  ____ Fleet Enema (as directed)   __X__ Use CHG Soap/SAGE wipes as directed  ____ Use inhalers on the day of surgery  ____ Stop metformin/Janumet/Farxiga 2 days prior to surgery    ____ Take 1/2 of usual insulin dose the night before surgery. No insulin the morning          of surgery.   ____ Stop Blood Thinners Coumadin/Plavix/Xarelto/Pleta/Pradaxa/Eliquis/Effient/Aspirin  on   Or contact your Surgeon, Cardiologist or Medical Doctor regarding  ability to stop your blood thinners  __X__ Stop Anti-inflammatories 7 days before surgery such as Advil, Ibuprofen, Motrin,  BC or Goodies Powder, Naprosyn, Naproxen, Aleve, Aspirin   You may take Tylenol if needed  __X__ Stop all herbals and supplements, fish oil or vitamins for 7 days until after surgery.    ____ Bring C-Pap to the hospital.

## 2021-04-23 ENCOUNTER — Telehealth: Payer: Self-pay | Admitting: Surgery

## 2021-04-23 ENCOUNTER — Ambulatory Visit (INDEPENDENT_AMBULATORY_CARE_PROVIDER_SITE_OTHER): Payer: Self-pay | Admitting: Surgery

## 2021-04-23 ENCOUNTER — Other Ambulatory Visit: Payer: Self-pay

## 2021-04-23 ENCOUNTER — Encounter: Payer: Self-pay | Admitting: Surgery

## 2021-04-23 VITALS — BP 147/93 | HR 73 | Temp 98.8°F | Ht 69.0 in | Wt 280.8 lb

## 2021-04-23 DIAGNOSIS — K436 Other and unspecified ventral hernia with obstruction, without gangrene: Secondary | ICD-10-CM

## 2021-04-23 NOTE — Patient Instructions (Addendum)
If you have any concerns or questions, please feel free to call our office. See follow up appointment below.   Advised to pursue a goal of 25 to 30 g of fiber daily.  Made aware that the majority of this may be through natural sources, but advised to be aware of actual consumption and to ensure minimal consumption by daily supplementation.  Various forms of supplements discussed.  Recommended Psyllium husk, that mixes well with applesauce, or the powder which goes down well shaken with chocolate milk.  Strongly advised to consume more fluids to ensure adequate hydration, instructed to watch color of urine to determine adequacy of hydration.  Clarity is pursued in urine output, and bowel activity that correlates to significant meal intake.   We need to avoid deferring having bowel movements, advised to take the time at the first sign of sensation, typically following meals, and in the morning.   Subsequent utilization of MiraLAX may be needed ensure at least daily movement, ideally twice daily bowel movements.  If multiple doses of MiraLAX are necessary utilize them. Never skip a day...  To be regular, we must do the above EVERY day.   Calorie Counting for Weight Loss  Calories are units of energy. Your body needs a certain number of calories from food to keep going throughout the day. When you eat or drink more calories than your body needs, your body stores the extra calories mostly as fat. When you eat or drink fewer calories than your body needs, your body burns fat to get the energy it needs. Calorie counting means keeping track of how many calories you eat and drink each day. Calorie counting can be helpful if you need to lose weight. If you eat fewer calories than your body needs, you should lose weight. Ask your health care provider what a healthy weight is for you. For calorie counting to work, you will need to eat the right number of calories each day to lose a healthy amount of weight per  week. A dietitian can help you figure out how many calories you need in a day and will suggest ways to reach your calorie goal. A healthy amount of weight to lose each week is usually 1-2 lb (0.5-0.9 kg). This usually means that your daily calorie intake should be reduced by 500-750 calories. Eating 1,200-1,500 calories a day can help most women lose weight. Eating 1,500-1,800 calories a day can help most men lose weight. What do I need to know about calorie counting? Work with your health care provider or dietitian to determine how many calories you should get each day. To meet your daily calorie goal, you will need to: Find out how many calories are in each food that you would like to eat. Try to do this before you eat. Decide how much of the food you plan to eat. Keep a food log. Do this by writing down what you ate and how many calories it had. To successfully lose weight, it is important to balance calorie counting with a healthy lifestyle that includes regular activity. Where do I find calorie information? The number of calories in a food can be found on a Nutrition Facts label. If a food does not have a Nutrition Facts label, try to look up the calories online or ask your dietitian for help. Remember that calories are listed per serving. If you choose to have more than one serving of a food, you will have to multiply the calories per serving  by the number of servings you plan to eat. For example, the label on a package of bread might say that a serving size is 1 slice and that there are 90 calories in a serving. If you eat 1 slice, you will have eaten 90 calories. If you eat 2 slices, you will have eaten 180 calories. How do I keep a food log? After each time that you eat, record the following in your food log as soon as possible: What you ate. Be sure to include toppings, sauces, and other extras on the food. How much you ate. This can be measured in cups, ounces, or number of items. How  many calories were in each food and drink. The total number of calories in the food you ate. Keep your food log near you, such as in a pocket-sized notebook or on an app or website on your mobile phone. Some programs will calculate calories for you and show you how many calories you have left to meet your daily goal. What are some portion-control tips? Know how many calories are in a serving. This will help you know how many servings you can have of a certain food. Use a measuring cup to measure serving sizes. You could also try weighing out portions on a kitchen scale. With time, you will be able to estimate serving sizes for some foods. Take time to put servings of different foods on your favorite plates or in your favorite bowls and cups so you know what a serving looks like. Try not to eat straight from a food's packaging, such as from a bag or box. Eating straight from the package makes it hard to see how much you are eating and can lead to overeating. Put the amount you would like to eat in a cup or on a plate to make sure you are eating the right portion. Use smaller plates, glasses, and bowls for smaller portions and to prevent overeating. Try not to multitask. For example, avoid watching TV or using your computer while eating. If it is time to eat, sit down at a table and enjoy your food. This will help you recognize when you are full. It will also help you be more mindful of what and how much you are eating. What are tips for following this plan? Reading food labels Check the calorie count compared with the serving size. The serving size may be smaller than what you are used to eating. Check the source of the calories. Try to choose foods that are high in protein, fiber, and vitamins, and low in saturated fat, trans fat, and sodium. Shopping Read nutrition labels while you shop. This will help you make healthy decisions about which foods to buy. Pay attention to nutrition labels for low-fat  or fat-free foods. These foods sometimes have the same number of calories or more calories than the full-fat versions. They also often have added sugar, starch, or salt to make up for flavor that was removed with the fat. Make a grocery list of lower-calorie foods and stick to it. Cooking Try to cook your favorite foods in a healthier way. For example, try baking instead of frying. Use low-fat dairy products. Meal planning Use more fruits and vegetables. One-half of your plate should be fruits and vegetables. Include lean proteins, such as chicken, [place], and fish. Lifestyle Each week, aim to do one of the following: 150 minutes of moderate exercise, such as walking. 75 minutes of vigorous exercise, such as running. General  information Know how many calories are in the foods you eat most often. This will help you calculate calorie counts faster. Find a way of tracking calories that works for you. Get creative. Try different apps or programs if writing down calories does not work for you. What foods should I eat?  Eat nutritious foods. It is better to have a nutritious, high-calorie food, such as an avocado, than a food with few nutrients, such as a bag of potato chips. Use your calories on foods and drinks that will fill you up and will not leave you hungry soon after eating. Examples of foods that fill you up are nuts and nut butters, vegetables, lean proteins, and high-fiber foods such as whole grains. High-fiber foods are foods with more than 5 g of fiber per serving. Pay attention to calories in drinks. Low-calorie drinks include water and unsweetened drinks. The items listed above may not be a complete list of foods and beverages you can eat. Contact a dietitian for more information. What foods should I limit? Limit foods or drinks that are not good sources of vitamins, minerals, or protein or that are high in unhealthy fats. These include: Candy. Other sweets. Sodas, specialty coffee  drinks, alcohol, and juice. The items listed above may not be a complete list of foods and beverages you should avoid. Contact a dietitian for more information. How do I count calories when eating out? Pay attention to portions. Often, portions are much larger when eating out. Try these tips to keep portions smaller: Consider sharing a meal instead of getting your own. If you get your own meal, eat only half of it. Before you start eating, ask for a container and put half of your meal into it. When available, consider ordering smaller portions from the menu instead of full portions. Pay attention to your food and drink choices. Knowing the way food is cooked and what is included with the meal can help you eat fewer calories. If calories are listed on the menu, choose the lower-calorie options. Choose dishes that include vegetables, fruits, whole grains, low-fat dairy products, and lean proteins. Choose items that are boiled, broiled, grilled, or steamed. Avoid items that are buttered, battered, fried, or served with cream sauce. Items labeled as crispy are usually fried, unless stated otherwise. Choose water, low-fat milk, unsweetened iced tea, or other drinks without added sugar. If you want an alcoholic beverage, choose a lower-calorie option, such as a glass of wine or light beer. Ask for dressings, sauces, and syrups on the side. These are usually high in calories, so you should limit the amount you eat. If you want a salad, choose a garden salad and ask for grilled meats. Avoid extra toppings such as bacon, cheese, or fried items. Ask for the dressing on the side, or ask for olive oil and vinegar or lemon to use as dressing. Estimate how many servings of a food you are given. Knowing serving sizes will help you be aware of how much food you are eating at restaurants. Where to find more information Centers for Disease Control and Prevention: http://www.wolf.info/ U.S. Department of Agriculture:  http://www.wilson-mendoza.org/ Summary Calorie counting means keeping track of how many calories you eat and drink each day. If you eat fewer calories than your body needs, you should lose weight. A healthy amount of weight to lose per week is usually 1-2 lb (0.5-0.9 kg). This usually means reducing your daily calorie intake by 500-750 calories. The number of calories in a  food can be found on a Nutrition Facts label. If a food does not have a Nutrition Facts label, try to look up the calories online or ask your dietitian for help. Use smaller plates, glasses, and bowls for smaller portions and to prevent overeating. Use your calories on foods and drinks that will fill you up and not leave you hungry shortly after a meal. This information is not intended to replace advice given to you by your health care provider. Make sure you discuss any questions you have with your health care provider. Document Revised: 03/22/2019 Document Reviewed: 03/22/2019 Elsevier Patient Education  2022 [place] of Quitting Smoking  Quitting smoking is a physical and mental challenge. You will face cravings, withdrawal symptoms, and temptation. Before quitting, work with your health care provider to make a plan that can help you manage quitting. Preparation can help you quit and keep you from giving in. How to manage lifestyle changes Managing stress Stress can make you want to smoke, and wanting to smoke may cause stress. It is important to find ways to manage your stress. You might try some of the following: Practice relaxation techniques. Breathe slowly and deeply, in through your nose and out through your mouth. Listen to music. Soak in a bath or take a shower. Imagine a peaceful place or vacation. Get some support. Talk with family or friends about your stress. Join a support group. Talk with a counselor or therapist. Get some physical activity. Go for a walk, run, or bike ride. Play a favorite  sport. Practice yoga.  Medicines Talk with your health care provider about medicines that might help you deal with cravings and make quitting easier for you. Relationships Social situations can be difficult when you are quitting smoking. To manage this, you can: Avoid parties and other social situations where people might be smoking. Avoid alcohol. Leave right away if you have the urge to smoke. Explain to your family and friends that you are quitting smoking. Ask for support and let them know you might be a bit grumpy. Plan activities where smoking is not an option. General instructions Be aware that many people gain weight after they quit smoking. However, not everyone does. To keep from gaining weight, have a plan in place before you quit and stick to the plan after you quit. Your plan should include: Having healthy snacks. When you have a craving, it may help to: Eat popcorn, carrots, celery, or other cut vegetables. Chew sugar-free gum. Changing how you eat. Eat small portion sizes at meals. Eat 4-6 small meals throughout the day instead of 1-2 large meals a day. Be mindful when you eat. Do not watch television or do other things that might distract you as you eat. Exercising regularly. Make time to exercise each day. If you do not have time for a long workout, do short bouts of exercise for 5-10 minutes several times a day. Do some form of strengthening exercise, such as weight lifting. Do some exercise that gets your heart beating and causes you to breathe deeply, such as walking fast, running, swimming, or biking. This is very important. Drinking plenty of water or other low-calorie or no-calorie drinks. Drink 6-8 glasses of water daily.  How to recognize withdrawal symptoms Your body and mind may experience discomfort as you try to get used to not having nicotine in your system. These effects are called withdrawal symptoms. They may include: Feeling hungrier than normal. Having  trouble concentrating.  Feeling irritable or restless. Having trouble sleeping. Feeling depressed. Craving a cigarette. To manage withdrawal symptoms: Avoid places, people, and activities that trigger your cravings. Remember why you want to quit. Get plenty of sleep. Avoid coffee and other caffeinated drinks. These may worsen some of your symptoms. These symptoms may surprise you. But be assured that they are normal to have when quitting smoking. How to manage cravings Come up with a plan for how to deal with your cravings. The plan should include the following: A definition of the specific situation you want to deal with. An alternative action you will take. A clear idea for how this action will help. The name of someone who might help you with this. Cravings usually last for 5-10 minutes. Consider taking the following actions to help you with your plan to deal with cravings: Keep your mouth busy. Chew sugar-free gum. Suck on hard candies or a straw. Brush your teeth. Keep your hands and body busy. Change to a different activity right away. Squeeze or play with a ball. Do an activity or a hobby, such as making bead jewelry, practicing needlepoint, or working with wood. Mix up your normal routine. Take a short exercise break. Go for a quick walk or run up and down stairs. Focus on doing something kind or helpful for someone else. Call a friend or family member to talk during a craving. Join a support group. Contact a quitline. Where to find support To get help or find a support group: Call the [facility]'s Smoking Quitline: 1-800-QUIT NOW 562-743-6330) Visit the website of the Substance Abuse and [place]: ktimeonline.com Text QUIT to SmokefreeTXTAZ:4618977 Where to find more information Visit these websites to find more information on quitting smoking: [place]: www.smokefree.gov American Lung Association:  www.lung.org American Cancer Society: www.cancer.org Centers for Disease Control and Prevention: http://www.wolf.info/ American Heart Association: www.heart.org Contact a health care provider if: You want to change your plan for quitting. The medicines you are taking are not helping. Your eating feels out of control or you cannot sleep. Get help right away if: You feel depressed or become very anxious. Summary Quitting smoking is a physical and mental challenge. You will face cravings, withdrawal symptoms, and temptation to smoke again. Preparation can help you as you go through these challenges. Try different techniques to manage stress, handle social situations, and prevent weight gain. You can deal with cravings by keeping your mouth busy (such as by chewing gum), keeping your hands and body busy, calling family or friends, or contacting a quitline for people who want to quit smoking. You can deal with withdrawal symptoms by avoiding places where people smoke, getting plenty of rest, and avoiding drinks with caffeine. This information is not intended to replace advice given to you by your health care provider. Make sure you discuss any questions you have with your health care provider. Document Revised: 10/17/2020 Document Reviewed: 11/28/2018 Elsevier Patient Education  [place].

## 2021-04-23 NOTE — Progress Notes (Signed)
Patient ID: Kelsey Sampson, female   DOB: Jan 31, 1971, 51 y.o.   MRN: [id number]  Chief Complaint: Ventral hernia  History of Present Illness Kelsey Sampson is a 51 y.o. female with a prior history of umbilical hernia repair as a child.  Redeveloped acute abdominal pain and was seen, evaluated and CT scan confirmed a fat filled supraumbilical abdominal wall hernia, whose "base" measures 1.8 cm.  No bowel involvement.  She reports a bulge that is tender on palpation.  She denies any vomiting, fevers and chills.  She reports some nausea, with normal bowel movements. She is a smoker and pain is exacerbated with her cough.  Desires to quit smoking, and feels it would not be an issue preop.  We also discussed other optimization including weight loss and she is motivated, but I do not believe we should defer her surgery until this happens.  Past Medical History Past Medical History:  Diagnosis Date   Abscess of buttock, left       Past Surgical History:  Procedure Laterality Date   ABDOMINAL HYSTERECTOMY      No Known Allergies  Current Outpatient Medications  Medication Sig Dispense Refill   Ascorbic Acid (VITAMIN C PO) Take 1 tablet by mouth daily.     ibuprofen (ADVIL) 200 MG tablet Take 600 mg by mouth every 6 (six) hours as needed for moderate pain or headache.     Multiple Vitamin (MULTIVITAMIN WITH MINERALS) TABS tablet Take 1 tablet by mouth daily.     No current facility-administered medications for this visit.    Family History History reviewed. No pertinent family history.    Social History Social History   Tobacco Use   Smoking status: Former    Types: Cigarettes    Quit date: 11/30/2020    Years since quitting: 0.3   Smokeless tobacco: Never  Vaping Use   Vaping Use: Some days   Substances: Flavoring  Substance Use Topics   Alcohol use: No   Drug use: Not Currently    Types: Marijuana    Comment: quit 30 days ago        Review of Systems  Constitutional:  Negative.   HENT: Negative.    Eyes: Negative.   Respiratory: Negative.    Cardiovascular: Negative.   Gastrointestinal:  Positive for constipation. Negative for abdominal pain and nausea.  Skin: Negative.   Neurological: Negative.   Psychiatric/Behavioral: Negative.       Physical Exam Blood pressure (!) 147/93, pulse 73, temperature 98.8 F (37.1 C), temperature source Oral, height 5\' 9"  (1.753 m), weight 280 lb 12.8 oz (127.4 kg). Last Weight  Most recent update: 04/23/2021  8:31 AM    Weight  127.4 kg (280 lb 12.8 oz)             CONSTITUTIONAL: Well developed, and nourished, appropriately responsive and aware without distress.   EYES: Sclera non-icteric.   EARS, NOSE, MOUTH AND THROAT: Mask worn.   The oropharynx is clear. Oral mucosa is pink and moist.   Hearing is intact to voice.  NECK: Trachea is midline, and there is no jugular venous distension.  LYMPH NODES:  Lymph nodes in the neck are not enlarged. RESPIRATORY:  Lungs are clear, and breath sounds are equal bilaterally. Normal respiratory effort without pathologic use of accessory muscles. CARDIOVASCULAR: Heart is regular in rate and rhythm. GI: The abdomen is notable for a supraumbilical mass that is tender to touch, nonreducible.  Her abdomen is otherwise soft, nontender,  and nondistended. There were no other palpable masses. I did not appreciate hepatosplenomegaly. There were normal bowel sounds. MUSCULOSKELETAL:  Symmetrical muscle tone appreciated in all four extremities.    SKIN: Skin turgor is normal. No pathologic skin lesions appreciated.  NEUROLOGIC:  Motor and sensation appear grossly normal.  Cranial nerves are grossly without defect. PSYCH:  Alert and oriented to person, place and time. Affect is appropriate for situation.  Data Reviewed I have personally reviewed what is currently available of the patient's imaging, recent labs and medical records.   Labs:  CBC Latest Ref Rng & Units 04/21/2021 03/21/2021  12/06/2013  WBC 4.0 - 10.5 K/uL 7.6 9.8 12.8(H)  Hemoglobin 12.0 - 15.0 g/dL 68.3 15.1(H) 14.1  Hematocrit 36.0 - 46.0 % 44.9 46.8(H) 44.0  Platelets 150 - 400 K/uL 241 250 239   CMP Latest Ref Rng & Units 04/21/2021 03/21/2021 12/06/2013  Glucose 70 - 99 mg/dL 419(Q) 222(L) 798(X)  BUN 6 - 20 mg/dL 12 11 5(L)  Creatinine 0.44 - 1.00 mg/dL 2.11 9.41 7.40  Sodium 135 - 145 mmol/L 136 140 141  Potassium 3.5 - 5.1 mmol/L 3.7 4.1 4.0  Chloride 98 - 111 mmol/L 103 107 109(H)  CO2 22 - 32 mmol/L 27 25 26   Calcium 8.9 - 10.3 mg/dL 9.2 9.3 8.6  Total Protein 6.5 - 8.1 g/dL 7.8 7.6 8.0  Total Bilirubin 0.3 - 1.2 mg/dL 0.6 0.5 0.4  Alkaline Phos 38 - 126 U/L 119 113 109  AST 15 - 41 U/L 22 18 13(L)  ALT 0 - 44 U/L 27 19 16       Imaging: Radiology review:  ADDENDUM REPORT: 03/21/2021 10:38   ADDENDUM: The midline hernia is actually supraumbilical, lying 3 cm above the umbilicus. This was not present on the prior CT.     Electronically Signed   By: M.D.   On: 03/21/2021 10:38    Addended by Amie Portland, MD on 03/21/2021 10:40 AM   Study Result  Narrative & Impression  CLINICAL DATA:  Triage note: Pt via POV from home. Pt c/o lower abd pain that radiates to her back that started yesterday, pt noted to have a umbilical hernia that she has not been dx with. States that it hurts her when she coughs. Denies NVD. Pt A&OX4 and NAD.   EXAM: CT ABDOMEN AND PELVIS WITH CONTRAST   TECHNIQUE: Multidetector CT imaging of the abdomen and pelvis was performed using the standard protocol following bolus administration of intravenous contrast.   RADIATION DOSE REDUCTION: This exam was performed according to the departmental dose-optimization program which includes automated exposure control, adjustment of the mA and/or kV according to patient size and/or use of iterative reconstruction technique.   CONTRAST:  Amie Portland OMNIPAQUE IOHEXOL 300 MG/ML  SOLN   COMPARISON:   04/28/2013   FINDINGS: Lower chest: No acute abnormality.   Hepatobiliary: Liver normal in size and overall attenuation. There several low-density liver lesions, largest in the posterior aspect of segment 2, 1.8 cm, all consistent with cysts. No other liver lesions. Gallbladder is distended. There is hypoattenuating material, higher in attenuation than fluid, within the gallbladder, consistent with sludge. No radiopaque stones. No wall thickening or inflammation. No bile duct dilation.   Pancreas: Unremarkable. No pancreatic ductal dilatation or surrounding inflammatory changes.   Spleen: Normal in size without focal abnormality.   Adrenals/Urinary Tract: Adrenal glands are unremarkable. Kidneys are normal, without renal calculi, focal lesion, or hydronephrosis. Bladder is unremarkable.   Stomach/Bowel:  Normal stomach. Small bowel and colon are normal in caliber. No wall thickening or inflammation. There is fat attenuation along the wall of the colon, nonspecific, finding that can be seen as result of remote inflammation. There are scattered left colon diverticula without diverticulitis. Normal appendix visualized.   Vascular/Lymphatic: Mild aortic atherosclerosis. No aneurysm. No enlarged lymph nodes.   Reproductive: Uterus and bilateral adnexa are unremarkable.   Other: Fat containing umbilical hernia base measuring 1.8 cm. No bowel enters this. Herniated fat is surrounded by a rim of increased attenuation and mild haziness in the adjacent subcutaneous fat suggesting inflammation.   No ascites.   Musculoskeletal: No fracture or acute finding.  No bone lesion.   IMPRESSION: 1. Small fat containing umbilical hernia associated with mild inflammatory changes. No bowel enters the hernia. 2. No other evidence of an acute abnormality. 3. Left colon diverticula without evidence of diverticulitis. 4. Aortic atherosclerosis.   Electronically Signed: By: Amie Portland  M.D. On: 03/21/2021 10:14   Within last 24 hrs: No results found.  Assessment     Patient Active Problem List   Diagnosis Date Noted   Morbid obesity (HCC) 03/26/2021   Incarcerated ventral hernia 03/26/2021   Tobacco use disorder 03/26/2021   Abscess of buttock, left     Plan    We discussed smoking cessation and she desires to pursue this prior to proceeding with surgery.  We also discussed weight loss, which she appears readily interested in.  We will have her follow-up in three months, anticipating deferring scheduling surgery to improve pre-op status.  Advised to pursue a goal of 25 to 30 g of fiber daily.  Made aware that the majority of this may be through natural sources, but advised to be aware of actual consumption and to ensure minimal consumption by daily supplementation.  Various forms of supplements discussed.  Recommended Psyllium husk, that mixes well with applesauce, or the powder which goes down well shaken with chocolate milk.  Strongly advised to consume more fluids to ensure adequate hydration, instructed to watch color of urine to determine adequacy of hydration.  Clarity is pursued in urine output, and bowel activity that correlates to significant meal intake.   We need to avoid deferring having bowel movements, advised to take the time at the first sign of sensation, typically following meals, and in the morning.   Subsequent utilization of MiraLAX may be needed ensure at least daily movement, ideally twice daily bowel movements.  If multiple doses of MiraLAX are necessary utilize them. Never skip a day...  To be regular, we must do the above EVERY day.    Weight loss assistance discussed.   Face-to-face time spent with the patient and accompanying care providers(if present) was 20 minutes, with more than 50% of the time spent counseling, educating, and coordinating care of the patient.    These notes generated with voice recognition software. I apologize for  typographical errors.  Campbell Lerner M.D., FACS 04/23/2021, 9:03 AM

## 2021-04-23 NOTE — Telephone Encounter (Signed)
Per Dr. Claudine Mouton will cancel surgery scheduled for 05/01/21 at patient's request and she is wanting to pursue weight loss and defer surgery for now.  Patient will call back when decides if she wants to reschedule surgery.  ? ?

## 2021-04-24 LAB — NICOTINE/COTININE METABOLITES
Cotinine: 173.2 ng/mL
Nicotine: 3.3 ng/mL

## 2021-04-24 LAB — MISC LABCORP TEST (SEND OUT): Labcorp test code: 9985

## 2021-05-01 ENCOUNTER — Ambulatory Visit: Admit: 2021-05-01 | Payer: Self-pay | Admitting: Surgery

## 2021-05-01 SURGERY — REPAIR, HERNIA, VENTRAL, ROBOT-ASSISTED
Anesthesia: General

## 2021-06-15 ENCOUNTER — Telehealth: Payer: Self-pay

## 2021-06-15 NOTE — Telephone Encounter (Signed)
Patient states she passed bright red blood in her stool. Less than a teaspoon amount. Not having constipation or diarrhea. She has not had a colonoscopy. This is her first episode of passing blood. She denies pain. No issues with the Umbilical hernia right now. Patient added to schedule tomorrow.  ?

## 2021-06-16 ENCOUNTER — Ambulatory Visit: Payer: Self-pay | Admitting: Surgery

## 2021-07-28 ENCOUNTER — Ambulatory Visit: Payer: Self-pay | Admitting: Surgery

## 2021-08-04 ENCOUNTER — Ambulatory Visit: Payer: 59 | Admitting: Surgery

## 2021-08-04 ENCOUNTER — Encounter: Payer: Self-pay | Admitting: Surgery

## 2021-08-04 VITALS — BP 142/99 | HR 99 | Temp 98.4°F | Wt 269.8 lb

## 2021-08-04 DIAGNOSIS — K436 Other and unspecified ventral hernia with obstruction, without gangrene: Secondary | ICD-10-CM

## 2021-08-04 NOTE — Progress Notes (Signed)
Patient ID: Kelsey Sampson, female   DOB: Nov 04, 1970, 51 y.o.   MRN: [id number]  Chief Complaint: Ventral hernia  History of Present Illness In the 2266-month follow-up visit today, she is lost over 10 pounds.  She is delighted, she reported she was not checking her weight because she did not want to be disappointed.  She has quit smoking.  She reports her bowel habits are easy and regular. Previously Kelsey Sampson is a 51 y.o. female with a prior history of umbilical hernia repair as a child.  Redeveloped acute abdominal pain and was seen, evaluated and CT scan confirmed a fat filled supraumbilical abdominal wall hernia, whose "base" measures 1.8 cm.  No bowel involvement.  She reports a bulge that is tender on palpation.  She denies any vomiting, fevers and chills.  She reports some nausea, with normal bowel movements. She is a smoker and pain is exacerbated with her cough.  Desires to quit smoking, and feels it would not be an issue preop.  We also discussed other optimization including weight loss and she is motivated, but I do not believe we should defer her surgery until this happens.  Past Medical History Past Medical History:  Diagnosis Date   Abscess of buttock, left    Tobacco use disorder 03/26/2021      Past Surgical History:  Procedure Laterality Date   ABDOMINAL HYSTERECTOMY      No Known Allergies  Current Outpatient Medications  Medication Sig Dispense Refill   Ascorbic Acid (VITAMIN C PO) Take 1 tablet by mouth daily.     ibuprofen (ADVIL) 200 MG tablet Take 600 mg by mouth every 6 (six) hours as needed for moderate pain or headache.     Multiple Vitamin (MULTIVITAMIN WITH MINERALS) TABS tablet Take 1 tablet by mouth daily.     No current facility-administered medications for this visit.    Family History History reviewed. No pertinent family history.    Social History Social History   Tobacco Use   Smoking status: Former    Types: Cigarettes    Quit date:  11/30/2020    Years since quitting: 0.6   Smokeless tobacco: Never  Vaping Use   Vaping Use: Some days   Substances: Flavoring  Substance Use Topics   Alcohol use: No   Drug use: Not Currently    Types: Marijuana    Comment: quit 30 days ago        Review of Systems  Constitutional: Negative.   HENT: Negative.    Eyes: Negative.   Respiratory: Negative.    Cardiovascular: Negative.   Gastrointestinal:  Positive for constipation. Negative for abdominal pain and nausea.  Skin: Negative.   Neurological: Negative.   Psychiatric/Behavioral: Negative.        Physical Exam Blood pressure (!) 142/99, pulse 99, temperature 98.4 F (36.9 C), temperature source Oral, weight 269 lb 12.8 oz (122.4 kg). Last Weight  Most recent update: 08/04/2021 10:41 AM    Weight  122.4 kg (269 lb 12.8 oz)             CONSTITUTIONAL: Well developed, and nourished, appropriately responsive and aware without distress.   EYES: Sclera non-icteric.   EARS, NOSE, MOUTH AND THROAT: Mask worn.   The oropharynx is clear. Oral mucosa is pink and moist.   Hearing is intact to voice.  NECK: Trachea is midline, and there is no jugular venous distension.  LYMPH NODES:  Lymph nodes in the neck are not enlarged. RESPIRATORY:  Lungs are clear, and breath sounds are equal bilaterally. Normal respiratory effort without pathologic use of accessory muscles. CARDIOVASCULAR: Heart is regular in rate and rhythm. GI: The abdomen is notable for a supraumbilical mass that is tender to touch, nonreducible.  Her abdomen is otherwise soft, nontender, and nondistended. There were no other palpable masses. I did not appreciate hepatosplenomegaly. There were normal bowel sounds. MUSCULOSKELETAL:  Symmetrical muscle tone appreciated in all four extremities.    SKIN: Skin turgor is normal. No pathologic skin lesions appreciated.  NEUROLOGIC:  Motor and sensation appear grossly normal.  Cranial nerves are grossly without  defect. PSYCH:  Alert and oriented to person, place and time. Affect is appropriate for situation.  Data Reviewed I have personally reviewed what is currently available of the patient's imaging, recent labs and medical records.   Labs:     Latest Ref Rng & Units 04/21/2021   10:49 AM 03/21/2021    7:33 AM 12/06/2013   10:59 PM  CBC  WBC 4.0 - 10.5 K/uL 7.6  9.8  12.8   Hemoglobin 12.0 - 15.0 g/dL 73.7  10.6  26.9   Hematocrit 36.0 - 46.0 % 44.9  46.8  44.0   Platelets 150 - 400 K/uL 241  250  239       Latest Ref Rng & Units 04/21/2021   10:49 AM 03/21/2021    7:33 AM 12/06/2013   10:59 PM  CMP  Glucose 70 - 99 mg/dL 485  462  703   BUN 6 - 20 mg/dL 12  11  5    Creatinine 0.44 - 1.00 mg/dL  5.00  9.38   Sodium 135 - 145 mmol/L 136  140  141   Potassium 3.5 - 5.1 mmol/L 3.7  4.1  4.0   Chloride 98 - 111 mmol/L 103  107  109   CO2 22 - 32 mmol/L 27  25  26    Calcium 8.9 - 10.3 mg/dL 9.2  9.3  8.6   Total Protein 6.5 - 8.1 g/dL 7.8  7.6  8.0   Total Bilirubin 0.3 - 1.2 mg/dL 0.6  0.5  0.4   Alkaline Phos 38 - 126 U/L 119  113  109   AST 15 - 41 U/L 22  18  13    ALT 0 - 44 U/L 27  19  16        Imaging: Radiology review:  ADDENDUM REPORT: 03/21/2021 10:38   ADDENDUM: The midline hernia is actually supraumbilical, lying 3 cm above the umbilicus. This was not present on the prior CT.     Electronically Signed   By: M.D.   On: 03/21/2021 10:38    Addended by , MD on 03/21/2021 10:40 AM   Study Result  Narrative & Impression  CLINICAL DATA:  Triage note: Pt via POV from home. Pt c/o lower abd pain that radiates to her back that started yesterday, pt noted to have a umbilical hernia that she has not been dx with. States that it hurts her when she coughs. Denies NVD. Pt A&OX4 and NAD.   EXAM: CT ABDOMEN AND PELVIS WITH CONTRAST   TECHNIQUE: Multidetector CT imaging of the abdomen and pelvis was performed using the standard protocol  following bolus administration of intravenous contrast.   RADIATION DOSE REDUCTION: This exam was performed according to the departmental dose-optimization program which includes automated exposure control, adjustment of the mA and/or kV according to patient size and/or use of iterative reconstruction  technique.   CONTRAST:  OMNIPAQUE IOHEXOL 300 MG/ML  SOLN   COMPARISON:  04/28/2013   FINDINGS: Lower chest: No acute abnormality.   Hepatobiliary: Liver normal in size and overall attenuation. There several low-density liver lesions, largest in the posterior aspect of segment 2, 1.8 cm, all consistent with cysts. No other liver lesions. Gallbladder is distended. There is hypoattenuating material, higher in attenuation than fluid, within the gallbladder, consistent with sludge. No radiopaque stones. No wall thickening or inflammation. No bile duct dilation.   Pancreas: Unremarkable. No pancreatic ductal dilatation or surrounding inflammatory changes.   Spleen: Normal in size without focal abnormality.   Adrenals/Urinary Tract: Adrenal glands are unremarkable. Kidneys are normal, without renal calculi, focal lesion, or hydronephrosis. Bladder is unremarkable.   Stomach/Bowel: Normal stomach. Small bowel and colon are normal in caliber. No wall thickening or inflammation. There is fat attenuation along the wall of the colon, nonspecific, finding that can be seen as result of remote inflammation. There are scattered left colon diverticula without diverticulitis. Normal appendix visualized.   Vascular/Lymphatic: Mild aortic atherosclerosis. No aneurysm. No enlarged lymph nodes.   Reproductive: Uterus and bilateral adnexa are unremarkable.   Other: Fat containing umbilical hernia base measuring 1.8 cm. No bowel enters this. Herniated fat is surrounded by a rim of increased attenuation and mild haziness in the adjacent subcutaneous fat suggesting inflammation.   No  ascites.   Musculoskeletal: No fracture or acute finding.  No bone lesion.   IMPRESSION: 1. Small fat containing umbilical hernia associated with mild inflammatory changes. No bowel enters the hernia. 2. No other evidence of an acute abnormality. 3. Left colon diverticula without evidence of diverticulitis. 4. Aortic atherosclerosis.   Electronically Signed: By: Amie Portland M.D. On: 03/21/2021 10:14   Within last 24 hrs: No results found.  Assessment     Patient Active Problem List   Diagnosis Date Noted   Morbid obesity (HCC) 03/26/2021   Incarcerated ventral hernia 03/26/2021    Plan    As she continues to be highly motivated to optimize her potential for a primary repair without utilization of mesh, we will continue to expect this trend of weight loss to continue.  She is delighted with this game plan we will see her back in 3 months.  Face-to-face time spent with the patient and accompanying care providers(if present) was 20 minutes, with more than 50% of the time spent counseling, educating, and coordinating care of the patient.    These notes generated with voice recognition software. I apologize for typographical errors.  Campbell Lerner M.D., FACS 08/04/2021, 10:52 AM

## 2021-08-04 NOTE — Patient Instructions (Signed)
If you have any concerns or questions, please feel free to call our office.   Umbilical Hernia, Adult  A hernia is a bulge of tissue that pushes through an opening between muscles. An umbilical hernia happens in the abdomen, near the belly button (umbilicus). The hernia may contain tissues from the small intestine, large intestine, or fatty tissue covering the intestines. Umbilical hernias in adults tend to get worse over time, and they require surgical treatment. There are different types of umbilical hernias, including: Indirect hernia. This type is located just above or below the umbilicus. It is the most common type of umbilical hernia in adults. Direct hernia. This type forms through an opening formed by the umbilicus. Reducible hernia. This type of hernia comes and goes. It may be visible only when you strain, lift something heavy, or cough. This type of hernia can be pushed back into the abdomen (reduced). Incarcerated hernia. This type traps abdominal tissue inside the hernia. This type of hernia cannot be reduced. Strangulated hernia. This type of hernia cuts off blood flow to the tissues inside the hernia. The tissues can start to die if this happens. This type of hernia requires emergency treatment. What are the causes? An umbilical hernia happens when tissue inside the abdomen presses on a weak area of the abdominal muscles. What increases the risk? You may have a greater risk of this condition if you: Are obese. Have had several pregnancies. Have a buildup of fluid inside your abdomen. Have had surgery that weakens the abdominal muscles. What are the signs or symptoms? The main symptom of this condition is a painless bulge at or near the belly button. A reducible hernia may be visible only when you strain, lift something heavy, or cough. Other symptoms may include: Dull pain. A feeling of pressure. Symptoms of a strangulated hernia may include: Pain that gets increasingly  worse. Nausea and vomiting. Pain when pressing on the hernia. Skin over the hernia becoming red or purple. Constipation. Blood in the stool. How is this diagnosed? This condition may be diagnosed based on: A physical exam. You may be asked to cough or strain while standing. These actions increase the pressure inside your abdomen and can force the hernia through the opening in your muscles. Your health care provider may try to reduce the hernia by pressing on it. Your symptoms and medical history. How is this treated? Surgery is the only treatment for an umbilical hernia. Surgery for a strangulated hernia is done as soon as possible. If you have a small hernia that is not incarcerated, you may need to lose weight before having surgery. Follow these instructions at home: Lose weight, if told by your health care provider. Do not try to push the hernia back in. Watch your hernia for any changes in color or size. Tell your health care provider if any changes occur. You may need to avoid activities that increase pressure on your hernia. Do not lift anything that is heavier than 10 lb (4.5 kg), or the limit that you are told, until your health care provider says that it is safe. Take over-the-counter and prescription medicines only as told by your health care provider. Keep all follow-up visits. This is important. Contact a health care provider if: Your hernia gets larger. Your hernia becomes painful. Get help right away if: You develop sudden, severe pain near the area of your hernia. You have pain as well as nausea or vomiting. You have pain and the skin over your   hernia changes color. You develop a fever or chills. Summary A hernia is a bulge of tissue that pushes through an opening between muscles. An umbilical hernia happens near the belly button. Surgery is the only treatment for an umbilical hernia. Do not try to push your hernia back in. Keep all follow-up visits. This is  important. This information is not intended to replace advice given to you by your health care provider. Make sure you discuss any questions you have with your health care provider. Document Revised: 09/17/2019 Document Reviewed: 09/17/2019 Elsevier Patient Education  2023 [employer]  

## 2021-09-07 ENCOUNTER — Telehealth: Payer: Self-pay

## 2021-09-07 NOTE — Telephone Encounter (Signed)
Left vm to confirm 09/10/21 appointment-Toni 

## 2021-09-10 ENCOUNTER — Ambulatory Visit (INDEPENDENT_AMBULATORY_CARE_PROVIDER_SITE_OTHER): Payer: 59 | Admitting: Physician Assistant

## 2021-09-10 ENCOUNTER — Encounter: Payer: Self-pay | Admitting: Physician Assistant

## 2021-09-10 VITALS — BP 160/90 | HR 84 | Temp 98.0°F | Resp 16 | Ht 69.0 in | Wt 276.2 lb

## 2021-09-10 DIAGNOSIS — E538 Deficiency of other specified B group vitamins: Secondary | ICD-10-CM | POA: Diagnosis not present

## 2021-09-10 DIAGNOSIS — Z1211 Encounter for screening for malignant neoplasm of colon: Secondary | ICD-10-CM

## 2021-09-10 DIAGNOSIS — E782 Mixed hyperlipidemia: Secondary | ICD-10-CM | POA: Diagnosis not present

## 2021-09-10 DIAGNOSIS — R5383 Other fatigue: Secondary | ICD-10-CM

## 2021-09-10 DIAGNOSIS — R030 Elevated blood-pressure reading, without diagnosis of hypertension: Secondary | ICD-10-CM | POA: Diagnosis not present

## 2021-09-10 DIAGNOSIS — Z1212 Encounter for screening for malignant neoplasm of rectum: Secondary | ICD-10-CM | POA: Diagnosis not present

## 2021-09-10 DIAGNOSIS — K436 Other and unspecified ventral hernia with obstruction, without gangrene: Secondary | ICD-10-CM

## 2021-09-10 DIAGNOSIS — E559 Vitamin D deficiency, unspecified: Secondary | ICD-10-CM

## 2021-09-10 DIAGNOSIS — R946 Abnormal results of thyroid function studies: Secondary | ICD-10-CM

## 2021-09-10 DIAGNOSIS — Z1231 Encounter for screening mammogram for malignant neoplasm of breast: Secondary | ICD-10-CM | POA: Diagnosis not present

## 2021-09-10 DIAGNOSIS — Z7689 Persons encountering health services in other specified circumstances: Secondary | ICD-10-CM

## 2021-09-10 MED ORDER — ZOSTER VAC RECOMB ADJUVANTED 50 MCG/0.5ML IM SUSR: 0.5000 mL | Freq: Once | INTRAMUSCULAR | 0 refills | Status: AC

## 2021-09-10 MED ORDER — TETANUS-DIPHTH-ACELL PERTUSSIS 5-2.5-18.5 LF-MCG/0.5 IM SUSY: 0.5000 mL | PREFILLED_SYRINGE | Freq: Once | INTRAMUSCULAR | 0 refills | Status: AC

## 2021-09-10 NOTE — Progress Notes (Signed)
[facility] [address] [place], [place] [address]  Internal MEDICINE  Office Visit Note  Patient Name: Kelsey Sampson  [id number]  [id number]  Date of Service: 09/16/2021   Complaints/HPI Pt is here for establishment of PCP. Chief Complaint  Patient presents with   New Patient (Initial Visit)   Arm Pain    Right arm pain, starts in shoulder, pt thinks it is possibly arthritis   Quality Metric Gaps    Pt is interested in mammogram, colonoscopy, papsmear, shingles   HPI Pt is here to establish care -Does have chronic pain of unknown origin. Especially right arm, but is managing -Has gone to ED or health dept if something needed in the past. Has not had PCP since she was a child -in need of pap, mammogram, and colonoscopy -did have blood in stool awhile back, but it has gone away -Plan for hernia surgery however was told she needed to lost some weight prior to doing surgery. 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 -tries to exercise in gaps at work with steps and arm movements, -Knows she drinks too much soda and not enough water. Also working on avoiding late night snacks and less fried foods. -had hysterectomy due to syphilis, however states it was partial and thinks she still has cervix -Unaware of hx of HTN, however BP elevated in office and will monitor -Due for routine blood work and shingls and tdap vaccines  Current Medication: Outpatient Encounter Medications as of 09/10/2021  Medication Sig   Ascorbic Acid (VITAMIN C PO) Take 1 tablet by mouth daily.   ibuprofen (ADVIL) 200 MG tablet Take 600 mg by mouth every 6 (six) hours as needed for moderate pain or headache.   Multiple Vitamin (MULTIVITAMIN WITH MINERALS) TABS tablet Take 1 tablet by mouth daily.   [DISCONTINUED] Tdap (BOOSTRIX) 5-2.5-18.5 LF-MCG/0.5 injection  Inject 0.5 mLs into the muscle once.   [DISCONTINUED] Zoster Vaccine Adjuvanted [facility]) injection Inject 0.5 mLs into the muscle once.   [EXPIRED] Tdap (BOOSTRIX) 5-2.5-18.5 LF-MCG/0.5 injection Inject 0.5 mLs into the muscle once for 1 dose.   [EXPIRED] Zoster Vaccine Adjuvanted [facility]) injection Inject 0.5 mLs into the muscle once for 1 dose.   No facility-administered encounter medications on file as of 09/10/2021.    Surgical History: Past Surgical History:  Procedure Laterality Date   ABDOMINAL HYSTERECTOMY      Medical History: Past Medical History:  Diagnosis Date   Abscess of buttock, left    Tobacco use disorder 03/26/2021    Family History: Family History  Problem Relation Age of Onset   Hypertension Mother     Social History   Socioeconomic History   Marital status: Single    Spouse name: Not on file   Number of children: Not on file   Years of education: Not on file   Highest education level: Not on file  Occupational History   Not on file  Tobacco Use   Smoking status: Former    Types: Cigarettes    Quit date: 11/30/2020    Years since quitting: 0.7   Smokeless tobacco: Never  Vaping Use   Vaping Use: Some days   Substances: Flavoring  Substance and Sexual Activity   Alcohol use: No   Drug use: Not Currently    Types: Marijuana    Comment: Couple times a week  Sexual activity: Not Currently    Partners: Male  Other Topics Concern   Not on file  Social History Narrative   Not on file   Social Determinants of Health   Financial Resource Strain: Not on file  Food Insecurity: Not on file  Transportation Needs: Not on file  Physical Activity: Not on file  Stress: Not on file  Social Connections: Not on file  Intimate Partner Violence: Not At Risk (04/02/2021)   Humiliation, Afraid, Rape, and Kick questionnaire    Fear of Current or Ex-Partner: No    Emotionally Abused: No    Physically Abused: No    Sexually Abused: No     Review of  Systems  Constitutional:  Negative for chills, fatigue and unexpected weight change.  HENT:  Negative for congestion, postnasal drip, rhinorrhea, sneezing and sore throat.   Eyes:  Negative for redness.  Respiratory:  Negative for cough, chest tightness and shortness of breath.   Cardiovascular:  Negative for chest pain and palpitations.  Gastrointestinal:  Negative for abdominal pain, constipation, diarrhea, nausea and vomiting.  Genitourinary:  Negative for dysuria and frequency.  Musculoskeletal:  Positive for arthralgias. Negative for back pain, joint swelling and neck pain.  Skin:  Negative for rash.  Neurological: Negative.  Negative for tremors and numbness.  Hematological:  Negative for adenopathy. Does not bruise/bleed easily.  Psychiatric/Behavioral:  Negative for behavioral problems (Depression), sleep disturbance and suicidal ideas. The patient is not nervous/anxious.     Vital Signs: BP (!) 160/90 Comment: 168/93  Pulse 84   Temp 98 F (36.7 C)   Resp 16   Ht 5\' 9"  (1.753 m)   Wt 276 lb 3.2 oz (125.3 kg)   SpO2 99%   BMI 40.79 kg/m    Physical Exam Vitals and nursing note reviewed.  Constitutional:      General: She is not in acute distress.    Appearance: She is well-developed. She is obese. She is not diaphoretic.  HENT:     Head: Normocephalic and atraumatic.     Mouth/Throat:     Pharynx: No oropharyngeal exudate.  Eyes:     Pupils: Pupils are equal, round, and reactive to light.  Neck:     Thyroid: No thyromegaly.     Vascular: No JVD.     Trachea: No tracheal deviation.  Cardiovascular:     Rate and Rhythm: Normal rate and regular rhythm.     Heart sounds: Normal heart sounds. No murmur heard.    No friction rub. No gallop.  Pulmonary:     Effort: Pulmonary effort is normal. No respiratory distress.     Breath sounds: No wheezing or rales.  Chest:     Chest wall: No tenderness.  Abdominal:     General: Bowel sounds are normal.     Palpations:  Abdomen is soft.  Musculoskeletal:        General: Normal range of motion.     Cervical back: Normal range of motion and neck supple.  Lymphadenopathy:     Cervical: No cervical adenopathy.  Skin:    General: Skin is warm and dry.  Neurological:     Mental Status: She is alert and oriented to person, place, and time.     Cranial Nerves: No cranial nerve deficit.  Psychiatric:        Behavior: Behavior normal.        Thought Content: Thought content normal.        Judgment: Judgment normal.  Assessment/Plan: 1. Incarcerated ventral hernia Established with general surgery with plan to move forward with surgical repair  2. Elevated BP without diagnosis of hypertension Patient denies a history of elevated blood pressure and as this is her first time being seen in office with an elevated reading we will have patient obtain monitor and keep a close eye on blood pressures.  If still elevated at next visit will likely need to initiate medication  3. Encounter to establish care Reviewed medical history and will order routine fasting labs and schedule CPE  4. B12 deficiency - B12 and Folate Panel  5. Vitamin D deficiency - VITAMIN D 25 Hydroxy (Vit-D Deficiency, Fractures)  6. Abnormal thyroid exam - TSH + free T4  7. Mixed hyperlipidemia - Lipid Panel With LDL/HDL Ratio  8. Encounter for colorectal cancer screening - Ambulatory referral to Gastroenterology  9. Visit for screening mammogram - MM 3D SCREEN BREAST BILATERAL; Future  10. Other fatigue - CBC w/Diff/Platelet - Comprehensive metabolic panel - TSH + free T4 - Lipid Panel With LDL/HDL Ratio - B12 and Folate Panel - VITAMIN D 25 Hydroxy (Vit-D Deficiency, Fractures)   General Counseling: Tinna verbalizes understanding of the findings of todays visit and agrees with plan of treatment. I have discussed any further diagnostic evaluation that may be needed or ordered today. We also reviewed her medications  today. she has been encouraged to call the office with any questions or concerns that should arise related to todays visit.    Counseling:    Orders Placed This Encounter  Procedures   MM 3D SCREEN BREAST BILATERAL   CBC w/Diff/Platelet   Comprehensive metabolic panel   TSH + free T4   Lipid Panel With LDL/HDL Ratio   B12 and Folate Panel   VITAMIN D 25 Hydroxy (Vit-D Deficiency, Fractures)   Ambulatory referral to Gastroenterology    Meds ordered this encounter  Medications   Zoster Vaccine Adjuvanted [facility]) injection    Sig: Inject 0.5 mLs into the muscle once for 1 dose.    Dispense:  0.5 mL    Refill:  0   Tdap (BOOSTRIX) 5-2.5-18.5 LF-MCG/0.5 injection    Sig: Inject 0.5 mLs into the muscle once for 1 dose.    Dispense:  0.5 mL    Refill:  0     This patient was seen by Drema Dallas, PA-C in collaboration with Dr. Clayborn Bigness as a part of collaborative care agreement.   Time spent:35 Minutes

## 2021-09-11 ENCOUNTER — Other Ambulatory Visit: Payer: Self-pay

## 2021-09-11 ENCOUNTER — Telehealth: Payer: Self-pay

## 2021-09-11 DIAGNOSIS — Z1211 Encounter for screening for malignant neoplasm of colon: Secondary | ICD-10-CM

## 2021-09-11 MED ORDER — NA SULFATE-K SULFATE-MG SULF 17.5-3.13-1.6 GM/177ML PO SOLN: 1.0000 | Freq: Once | ORAL | 0 refills | Status: AC

## 2021-09-11 NOTE — Telephone Encounter (Signed)
Gastroenterology Pre-Procedure Review  Request Date: 10/15/21 Requesting Physician: Dr. Tobi Bastos  PATIENT REVIEW QUESTIONS: The patient responded to the following health history questions as indicated:    1. Are you having any GI issues? no 2. Do you have a personal history of Polyps? no 3. Do you have a family history of Colon Cancer or Polyps? no 4. Diabetes Mellitus? no 5. Joint replacements in the past 12 months?no 6. Major health problems in the past 3 months?no 7. Any artificial heart valves, MVP, or defibrillator?no    MEDICATIONS & ALLERGIES:    Patient reports the following regarding taking any anticoagulation/antiplatelet therapy:   Plavix, Coumadin, Eliquis, Xarelto, Lovenox, Pradaxa, Brilinta, or Effient? no Aspirin? no  Patient confirms/reports the following medications:  Current Outpatient Medications  Medication Sig Dispense Refill   Ascorbic Acid (VITAMIN C PO) Take 1 tablet by mouth daily.     ibuprofen (ADVIL) 200 MG tablet Take 600 mg by mouth every 6 (six) hours as needed for moderate pain or headache.     Multiple Vitamin (MULTIVITAMIN WITH MINERALS) TABS tablet Take 1 tablet by mouth daily.     No current facility-administered medications for this visit.    Patient confirms/reports the following allergies:  No Known Allergies  No orders of the defined types were placed in this encounter.   AUTHORIZATION INFORMATION Primary Insurance: 1D#: Group #:  Secondary Insurance: 1D#: Group #:  SCHEDULE INFORMATION: Date: 10/15/21 Time: Location: [facility]

## 2021-09-18 DIAGNOSIS — E782 Mixed hyperlipidemia: Secondary | ICD-10-CM | POA: Diagnosis not present

## 2021-09-18 DIAGNOSIS — E559 Vitamin D deficiency, unspecified: Secondary | ICD-10-CM | POA: Diagnosis not present

## 2021-09-18 DIAGNOSIS — R5383 Other fatigue: Secondary | ICD-10-CM | POA: Diagnosis not present

## 2021-09-18 DIAGNOSIS — E538 Deficiency of other specified B group vitamins: Secondary | ICD-10-CM | POA: Diagnosis not present

## 2021-09-18 DIAGNOSIS — R946 Abnormal results of thyroid function studies: Secondary | ICD-10-CM | POA: Diagnosis not present

## 2021-09-19 LAB — VITAMIN D 25 HYDROXY (VIT D DEFICIENCY, FRACTURES): Vit D, 25-Hydroxy: 26.7 ng/mL — ABNORMAL LOW (ref 30.0–100.0)

## 2021-09-19 LAB — TSH+FREE T4
Free T4: 1.1 ng/dL (ref 0.82–1.77)
TSH: 2.15 u[IU]/mL (ref 0.450–4.500)

## 2021-09-19 LAB — CBC WITH DIFFERENTIAL/PLATELET
Basophils Absolute: 0 10*3/uL (ref 0.0–0.2)
Basos: 0 %
EOS (ABSOLUTE): 0.2 10*3/uL (ref 0.0–0.4)
Eos: 2 %
Hematocrit: 44.1 % (ref 34.0–46.6)
Hemoglobin: 14.8 g/dL (ref 11.1–15.9)
Immature Grans (Abs): 0 10*3/uL (ref 0.0–0.1)
Immature Granulocytes: 0 %
Lymphocytes Absolute: 2.8 10*3/uL (ref 0.7–3.1)
Lymphs: 33 %
MCH: 29.5 pg (ref 26.6–33.0)
MCHC: 33.6 g/dL (ref 31.5–35.7)
MCV: 88 fL (ref 79–97)
Monocytes Absolute: 0.6 10*3/uL (ref 0.1–0.9)
Monocytes: 7 %
Neutrophils Absolute: 5 10*3/uL (ref 1.4–7.0)
Neutrophils: 58 %
Platelets: 214 10*3/uL (ref 150–450)
RBC: 5.01 x10E6/uL (ref 3.77–5.28)
RDW: 13.4 % (ref 11.7–15.4)
WBC: 8.5 10*3/uL (ref 3.4–10.8)

## 2021-09-19 LAB — COMPREHENSIVE METABOLIC PANEL
ALT: 14 IU/L (ref 0–32)
AST: 14 IU/L (ref 0–40)
Albumin/Globulin Ratio: 1.4 (ref 1.2–2.2)
Albumin: 4.2 g/dL (ref 3.9–4.9)
Alkaline Phosphatase: 141 IU/L — ABNORMAL HIGH (ref 44–121)
BUN/Creatinine Ratio: 12 (ref 9–23)
BUN: 10 mg/dL (ref 6–24)
Bilirubin Total: 0.4 mg/dL (ref 0.0–1.2)
CO2: 22 mmol/L (ref 20–29)
Calcium: 9.6 mg/dL (ref 8.7–10.2)
Chloride: 105 mmol/L (ref 96–106)
Creatinine, Ser: 0.83 mg/dL (ref 0.57–1.00)
Globulin, Total: 3.1 g/dL (ref 1.5–4.5)
Glucose: 116 mg/dL — ABNORMAL HIGH (ref 70–99)
Potassium: 4.6 mmol/L (ref 3.5–5.2)
Sodium: 142 mmol/L (ref 134–144)
Total Protein: 7.3 g/dL (ref 6.0–8.5)
eGFR: 86 mL/min/{1.73_m2} (ref >59)

## 2021-09-19 LAB — LIPID PANEL WITH LDL/HDL RATIO
Cholesterol, Total: 220 mg/dL — ABNORMAL HIGH (ref 100–199)
HDL: 35 mg/dL — ABNORMAL LOW (ref >39)
LDL Chol Calc (NIH): 141 mg/dL — ABNORMAL HIGH (ref 0–99)
LDL/HDL Ratio: 4 ratio — ABNORMAL HIGH (ref 0.0–3.2)
Triglycerides: 242 mg/dL — ABNORMAL HIGH (ref 0–149)
VLDL Cholesterol Cal: 44 mg/dL — ABNORMAL HIGH (ref 5–40)

## 2021-09-19 LAB — B12 AND FOLATE PANEL
Folate: >20 ng/mL (ref >3.0)
Vitamin B-12: 409 pg/mL (ref 232–1245)

## 2021-09-20 ENCOUNTER — Encounter: Payer: Self-pay | Admitting: Emergency Medicine

## 2021-09-20 ENCOUNTER — Emergency Department
Admission: EM | Admit: 2021-09-20 | Discharge: 2021-09-20 | Disposition: A | Discharge status: Home / Self Care | Payer: 59 | Attending: Emergency Medicine | Admitting: Emergency Medicine

## 2021-09-20 ENCOUNTER — Other Ambulatory Visit: Payer: Self-pay

## 2021-09-20 DIAGNOSIS — Z720 Tobacco use: Secondary | ICD-10-CM | POA: Diagnosis not present

## 2021-09-20 DIAGNOSIS — I10 Essential (primary) hypertension: Secondary | ICD-10-CM | POA: Diagnosis not present

## 2021-09-20 MED ORDER — HYDROCHLOROTHIAZIDE 25 MG PO TABS: 25.0000 mg | ORAL_TABLET | Freq: Every day | ORAL | 0 refills | Status: DC

## 2021-09-20 MED ORDER — CLONIDINE HCL 0.1 MG PO TABS
0.2000 mg | ORAL_TABLET | Freq: Once | ORAL | Status: AC
Start: 2021-09-20 — End: 2021-09-20
  Administered 2021-09-20: 0.2 mg via ORAL
  Filled 2021-09-20: qty 2

## 2021-09-20 NOTE — ED Provider Notes (Signed)
[facility] Provider Note    Event Date/Time   First MD Initiated Contact with Patient 09/20/21 1000     (approximate)   History   Hypertension   HPI  Kelsey Sampson is a 51 y.o. female with new diagnosis of hypertension presents to the emergency department after she checked her blood pressure this morning and noted it to be elevated.  She states that she had an appointment with her primary care provider for physical about a week ago and had lab studies.  Her blood pressure was elevated at that visit and primary care requested that she keep a blood pressure diary.  She did not prescribe any medications that day.  Patient states that when she checked it this morning it was 201/121 and she became very concerned.  She denies blurred vision, chest pain, shortness of breath, or weakness.  She does have a headache.  No alleviating measures attempted prior to arrival.  Past Medical History:  Diagnosis Date   Abscess of buttock, left    Tobacco use disorder 03/26/2021     Physical Exam   Triage Vital Signs: ED Triage Vitals  Enc Vitals Group     BP 09/20/21 0930 (!) 166/100     Pulse Rate 09/20/21 0930 92     Resp 09/20/21 0930 18     Temp 09/20/21 0930 98.9 F (37.2 C)     Temp Source 09/20/21 0930 Oral     SpO2 09/20/21 0930 96 %     Weight 09/20/21 0926 275 lb 9.2 oz (125 kg)     Height 09/20/21 0926 5\' 9"  (1.753 m)     Head Circumference --      Peak Flow --      Pain Score 09/20/21 0926 0     Pain Loc --      Pain Edu? --      Excl. in GC? --     Most recent vital signs: Vitals:   09/20/21 1130 09/20/21 1200  BP: (!) 150/83 (!) 150/87  Pulse: (!) 112   Resp: 14 17  Temp:    SpO2: 99%     General: Awake, no distress.  CV:  Good peripheral perfusion.  Heart sounds regular without murmurs, rubs, or gallops Resp:  Normal effort.  Abd:  No distention.  Other:     ED Results / Procedures / Treatments   Labs (all labs ordered are listed,  but only abnormal results are displayed) Labs Reviewed - No data to display   EKG  Sinus rhythm with a rate of 85.   RADIOLOGY  Not indicated.  I have independently reviewed and interpreted imaging as well as reviewed report from radiology.  PROCEDURES:  Critical Care performed: No  Procedures   MEDICATIONS ORDERED IN ED:  Medications  cloNIDine (CATAPRES) tablet 0.2 mg (0.2 mg Oral Given 09/20/21 1110)     IMPRESSION / MDM / ASSESSMENT AND PLAN / ED COURSE   I reviewed the triage vital signs and the nursing notes.  Differential diagnosis includes, but is not limited to: hypertension, hypertensive urgency  Patient's presentation is most consistent with acute illness / injury with system symptoms.  51 year old female presenting to the emergency department for treatment and evaluation of hypertension.  Her only symptom is headache.  She is not currently on blood pressure medication.  Plan will be to give her clonidine 0.2 and monitor her blood pressure.  Patient is aware and agreeable to the plan.  Labs obtained on 09/18/2021 reviewed.  Renal function is normal.  BP responded well to clonidine.  Headache has resolved.  She will be discharged home with a prescription for hydrochlorothiazide and encouraged to follow-up with her primary care provider.  She was also encouraged to continue monitoring her blood pressures after starting this medication.  If symptoms change or worsen or she has new concerns and she is unable to see primary care right away, she is to return to the emergency department.      FINAL CLINICAL IMPRESSION(S) / ED DIAGNOSES   Final diagnoses:  Hypertension, unspecified type     Rx / DC Orders   ED Discharge Orders          Ordered    hydrochlorothiazide (HYDRODIURIL) 25 MG tablet  Daily        09/20/21 1223             Note:  This document was prepared using Dragon voice recognition software and may include unintentional dictation  errors.   Chinita Pester, FNP 09/20/21 1449    Sharyn Creamer, MD 09/20/21 [phone]

## 2021-09-20 NOTE — ED Triage Notes (Signed)
Pt reports her BP at home was 201/121. Pt states she was seen by her MD recently and had blood work done and while there her MD told her she needed to get a BP cuff at home and keep check on her BP. Pt states she did not want to put her meds just yet so she was watching and this am her reading was 201/121. Pt denies pain, SOB or other sx's. Pt states she feels like she might be getting a headache soon.

## 2021-09-20 NOTE — Discharge Instructions (Signed)
Continue monitoring your blood pressure and keeping your journal.  Take medication as prescribed.  Please follow-up with your primary care provider next week if possible.  Return to the emergency department for symptoms of concern if unable to schedule appointment.

## 2021-09-23 ENCOUNTER — Telehealth: Payer: Self-pay

## 2021-09-23 ENCOUNTER — Other Ambulatory Visit: Payer: Self-pay

## 2021-09-23 MED ORDER — ROSUVASTATIN CALCIUM 5 MG PO TABS: 5.0000 mg | ORAL_TABLET | Freq: Every day | ORAL | 1 refills | Status: DC

## 2021-09-23 NOTE — Telephone Encounter (Signed)
Pt notified for labs result and send crestor

## 2021-09-23 NOTE — Telephone Encounter (Signed)
-----   Message from Carlean Jews, PA-C sent at 09/22/2021  4:32 PM EDT ----- Please let her know that her sugar was elevated so we will need to monitor this. Also her cholesterol was significantly elevated and will need to work on diet and exercise, avoiding fried foods and consider starting on crestor 5mg  nightly. Please send if agreeable otherwise will discuss at visit. Vit D was also low and should supplement OTC

## 2021-10-01 ENCOUNTER — Ambulatory Visit
Admission: RE | Admit: 2021-10-01 | Discharge: 2021-10-01 | Disposition: A | Discharge status: Home / Self Care | Payer: 59 | Source: Ambulatory Visit | Attending: Physician Assistant | Admitting: Physician Assistant

## 2021-10-01 DIAGNOSIS — Z1231 Encounter for screening mammogram for malignant neoplasm of breast: Secondary | ICD-10-CM | POA: Diagnosis not present

## 2021-10-05 ENCOUNTER — Other Ambulatory Visit: Payer: Self-pay | Admitting: Physician Assistant

## 2021-10-05 DIAGNOSIS — R928 Other abnormal and inconclusive findings on diagnostic imaging of breast: Secondary | ICD-10-CM

## 2021-10-05 DIAGNOSIS — N63 Unspecified lump in unspecified breast: Secondary | ICD-10-CM

## 2021-10-08 ENCOUNTER — Ambulatory Visit (INDEPENDENT_AMBULATORY_CARE_PROVIDER_SITE_OTHER): Payer: Self-pay | Admitting: Physician Assistant

## 2021-10-08 ENCOUNTER — Encounter: Payer: Self-pay | Admitting: Physician Assistant

## 2021-10-08 DIAGNOSIS — I10 Essential (primary) hypertension: Secondary | ICD-10-CM | POA: Diagnosis not present

## 2021-10-08 DIAGNOSIS — G4709 Other insomnia: Secondary | ICD-10-CM | POA: Diagnosis not present

## 2021-10-08 DIAGNOSIS — R7301 Impaired fasting glucose: Secondary | ICD-10-CM | POA: Diagnosis not present

## 2021-10-08 DIAGNOSIS — E782 Mixed hyperlipidemia: Secondary | ICD-10-CM | POA: Diagnosis not present

## 2021-10-08 LAB — POCT GLYCOSYLATED HEMOGLOBIN (HGB A1C): Hemoglobin A1C: 6.2 % — AB (ref 4.0–5.6)

## 2021-10-08 MED ORDER — HYDROCHLOROTHIAZIDE 25 MG PO TABS: 25.0000 mg | ORAL_TABLET | Freq: Every day | ORAL | 3 refills | Status: DC

## 2021-10-08 MED ORDER — TRAZODONE HCL 50 MG PO TABS: 50.0000 mg | ORAL_TABLET | Freq: Every day | ORAL | 2 refills | Status: DC

## 2021-10-08 NOTE — Progress Notes (Signed)
[facility] [address] [place], [place] [address]  Internal MEDICINE  Office Visit Note  Patient Name: Kelsey Sampson  [id number]  [id number]  Date of Service: 10/13/2021  Chief Complaint  Patient presents with   Follow-up   Hyperlipidemia   Hypertension    HPI Pt is here for routine follow up for high blood pressure -Labs previously reviewed and started on crestor for high cholesterol. Sugar was also elevated therefore will check A1c in office -Tolerating cresor and is taking vitamin D supplement -Colonoscopy scheduled, did have abnormal mammogram and has an [place] to evaluate further  -cut back on sodas, down 11 lbs since last visit -Did go to ED since last visit when she was monitoring her BP and found to be very high. She was started on HCTZ and has been taking it since with BP controlled at home and in office today, therefore will continue on this. -Difficulty falling asleep and staying asleep. Will start on trazodone and will work on sleep hygiene--especially avoiding screen time before bed.   Current Medication: Outpatient Encounter Medications as of 10/08/2021  Medication Sig   Ascorbic Acid (VITAMIN C PO) Take 1 tablet by mouth daily.   ibuprofen (ADVIL) 200 MG tablet Take 600 mg by mouth every 6 (six) hours as needed for moderate pain or headache.   Multiple Vitamin (MULTIVITAMIN WITH MINERALS) TABS tablet Take 1 tablet by mouth daily.   rosuvastatin (CRESTOR) 5 MG tablet Take 1 tablet (5 mg total) by mouth daily.   traZODone (DESYREL) 50 MG tablet Take 1 tablet (50 mg total) by mouth at bedtime.   [DISCONTINUED] hydrochlorothiazide (HYDRODIURIL) 25 MG tablet Take 1 tablet (25 mg total) by mouth daily.   hydrochlorothiazide (HYDRODIURIL) 25 MG tablet Take 1 tablet (25 mg total) by mouth daily.   No facility-administered encounter medications on file as of 10/08/2021.    Surgical History: Past Surgical History:  Procedure Laterality Date   ABDOMINAL  HYSTERECTOMY     cyst removed Left    51 years old cyst removed    Medical History: Past Medical History:  Diagnosis Date   Abscess of buttock, left    Hyperlipidemia    Hypertension    Tobacco use disorder 03/26/2021    Family History: Family History  Problem Relation Age of Onset   Hypertension Mother     Social History   Socioeconomic History   Marital status: Single    Spouse name: Not on file   Number of children: Not on file   Years of education: Not on file   Highest education level: Not on file  Occupational History   Not on file  Tobacco Use   Smoking status: Former    Types: Cigarettes    Quit date: 11/30/2020    Years since quitting: 0.8   Smokeless tobacco: Never  Vaping Use   Vaping Use: Some days   Substances: Flavoring  Substance and Sexual Activity   Alcohol use: No   Drug use: Not Currently    Types: Marijuana    Comment: Couple times a week   Sexual activity: Not Currently    Partners: Male  Other Topics Concern   Not on file  Social History Narrative   Not on file   Social Determinants of Health   Financial Resource Strain: Not on file  Food Insecurity: Not on file  Transportation Needs: Not on file  Physical Activity: Not on file  Stress: Not on file  Social Connections: Not  on file  Intimate Partner Violence: Not At Risk (04/02/2021)   Humiliation, Afraid, Rape, and Kick questionnaire    Fear of Current or Ex-Partner: No    Emotionally Abused: No    Physically Abused: No    Sexually Abused: No      Review of Systems  Constitutional:  Negative for chills, fatigue and unexpected weight change.  HENT:  Negative for congestion, postnasal drip, rhinorrhea, sneezing and sore throat.   Eyes:  Negative for redness.  Respiratory:  Negative for cough, chest tightness and shortness of breath.   Cardiovascular:  Negative for chest pain and palpitations.  Gastrointestinal:  Negative for abdominal pain, constipation, diarrhea, nausea and  vomiting.  Genitourinary:  Negative for dysuria and frequency.  Musculoskeletal:  Positive for arthralgias. Negative for back pain, joint swelling and neck pain.  Skin:  Negative for rash.  Neurological: Negative.  Negative for tremors and numbness.  Hematological:  Negative for adenopathy. Does not bruise/bleed easily.  Psychiatric/Behavioral:  Positive for sleep disturbance. Negative for behavioral problems (Depression) and suicidal ideas. The patient is not nervous/anxious.     Vital Signs: BP 133/87   Pulse 100   Temp 97.8 F (36.6 C)   Resp 16   Ht 5\' 9"  (1.753 m)   Wt 265 lb (120.2 kg)   BMI 39.13 kg/m    Physical Exam Vitals and nursing note reviewed.  Constitutional:      General: She is not in acute distress.    Appearance: She is well-developed. She is obese. She is not diaphoretic.  HENT:     Head: Normocephalic and atraumatic.     Mouth/Throat:     Pharynx: No oropharyngeal exudate.  Eyes:     Pupils: Pupils are equal, round, and reactive to light.  Neck:     Thyroid: No thyromegaly.     Vascular: No JVD.     Trachea: No tracheal deviation.  Cardiovascular:     Rate and Rhythm: Normal rate and regular rhythm.     Heart sounds: Normal heart sounds. No murmur heard.    No friction rub. No gallop.  Pulmonary:     Effort: Pulmonary effort is normal. No respiratory distress.     Breath sounds: No wheezing or rales.  Chest:     Chest wall: No tenderness.  Abdominal:     General: Bowel sounds are normal.     Palpations: Abdomen is soft.  Musculoskeletal:        General: Normal range of motion.     Cervical back: Normal range of motion and neck supple.  Lymphadenopathy:     Cervical: No cervical adenopathy.  Skin:    General: Skin is warm and dry.  Neurological:     Mental Status: She is alert and oriented to person, place, and time.     Cranial Nerves: No cranial nerve deficit.  Psychiatric:        Behavior: Behavior normal.        Thought Content:  Thought content normal.        Judgment: Judgment normal.        Assessment/Plan: 1. Essential hypertension Stable, continue current medication - hydrochlorothiazide (HYDRODIURIL) 25 MG tablet; Take 1 tablet (25 mg total) by mouth daily.  Dispense: 90 tablet; Refill: 3  2. Other insomnia May start with 1/2 tab trazodone and increase to full tab if needed. Will also work on sleep hygiene - traZODone (DESYREL) 50 MG tablet; Take 1 tablet (50 mg total) by mouth  at bedtime.  Dispense: 30 tablet; Refill: 2  3. Impaired fasting glucose - POCT HgB A1C is 6.2 which is prediabetic. Discussed working on diet and exercise which she has already been doing in an effort to lose weight for hernia surgery and is down 11 lbs. Will continue to monitor  4. Mixed hyperlipidemia Tolerating crestor and will continue to work on diet and exercise   General Counseling: Minaal verbalizes understanding of the findings of todays visit and agrees with plan of treatment. I have discussed any further diagnostic evaluation that may be needed or ordered today. We also reviewed her medications today. she has been encouraged to call the office with any questions or concerns that should arise related to todays visit.    Orders Placed This Encounter  Procedures   POCT HgB A1C    Meds ordered this encounter  Medications   traZODone (DESYREL) 50 MG tablet    Sig: Take 1 tablet (50 mg total) by mouth at bedtime.    Dispense:  30 tablet    Refill:  2   hydrochlorothiazide (HYDRODIURIL) 25 MG tablet    Sig: Take 1 tablet (25 mg total) by mouth daily.    Dispense:  90 tablet    Refill:  3    This patient was seen by Lynn Ito, PA-C in collaboration with Dr. Beverely Risen as a part of collaborative care agreement.   Total time spent:30 Minutes Time spent includes review of chart, medications, test results, and follow up plan with the patient.      Dr Lyndon Code Internal medicine

## 2021-10-13 ENCOUNTER — Ambulatory Visit
Admission: RE | Admit: 2021-10-13 | Discharge: 2021-10-13 | Disposition: A | Discharge status: Home / Self Care | Payer: 59 | Source: Ambulatory Visit | Attending: Physician Assistant | Admitting: Physician Assistant

## 2021-10-13 DIAGNOSIS — N63 Unspecified lump in unspecified breast: Secondary | ICD-10-CM | POA: Diagnosis not present

## 2021-10-13 DIAGNOSIS — R928 Other abnormal and inconclusive findings on diagnostic imaging of breast: Secondary | ICD-10-CM | POA: Diagnosis not present

## 2021-10-13 DIAGNOSIS — N6311 Unspecified lump in the right breast, upper outer quadrant: Secondary | ICD-10-CM | POA: Diagnosis not present

## 2021-10-15 ENCOUNTER — Ambulatory Visit: Payer: 59 | Admitting: Anesthesiology

## 2021-10-15 ENCOUNTER — Ambulatory Visit
Admission: RE | Admit: 2021-10-15 | Discharge: 2021-10-15 | Disposition: A | Discharge status: Home / Self Care | Payer: 59 | Source: Ambulatory Visit | Attending: Gastroenterology | Admitting: Gastroenterology

## 2021-10-15 ENCOUNTER — Encounter
Admission: RE | Disposition: A | Discharge status: Home / Self Care | Payer: Self-pay | Source: Ambulatory Visit | Attending: Gastroenterology

## 2021-10-15 ENCOUNTER — Encounter: Payer: Self-pay | Admitting: Gastroenterology

## 2021-10-15 DIAGNOSIS — Z1211 Encounter for screening for malignant neoplasm of colon: Secondary | ICD-10-CM | POA: Insufficient documentation

## 2021-10-15 DIAGNOSIS — K5730 Diverticulosis of large intestine without perforation or abscess without bleeding: Secondary | ICD-10-CM | POA: Diagnosis not present

## 2021-10-15 DIAGNOSIS — Z87891 Personal history of nicotine dependence: Secondary | ICD-10-CM | POA: Diagnosis not present

## 2021-10-15 DIAGNOSIS — I10 Essential (primary) hypertension: Secondary | ICD-10-CM | POA: Diagnosis not present

## 2021-10-15 DIAGNOSIS — E669 Obesity, unspecified: Secondary | ICD-10-CM | POA: Diagnosis not present

## 2021-10-15 DIAGNOSIS — Z6839 Body mass index (BMI) 39.0-39.9, adult: Secondary | ICD-10-CM | POA: Diagnosis not present

## 2021-10-15 DIAGNOSIS — E785 Hyperlipidemia, unspecified: Secondary | ICD-10-CM | POA: Diagnosis not present

## 2021-10-15 DIAGNOSIS — F17290 Nicotine dependence, other tobacco product, uncomplicated: Secondary | ICD-10-CM | POA: Diagnosis not present

## 2021-10-15 DIAGNOSIS — Z79899 Other long term (current) drug therapy: Secondary | ICD-10-CM | POA: Diagnosis not present

## 2021-10-15 DIAGNOSIS — R69 Illness, unspecified: Secondary | ICD-10-CM | POA: Diagnosis not present

## 2021-10-15 HISTORY — PX: COLONOSCOPY WITH PROPOFOL: SHX5780

## 2021-10-15 SURGERY — COLONOSCOPY WITH PROPOFOL
Anesthesia: General

## 2021-10-15 MED ORDER — PROPOFOL 500 MG/50ML IV EMUL
INTRAVENOUS | Status: DC | PRN
  Administered 2021-10-15: 140 ug/kg/min via INTRAVENOUS

## 2021-10-15 MED ORDER — SODIUM CHLORIDE 0.9 % IV SOLN
INTRAVENOUS | Status: DC
  Administered 2021-10-15: 1000 mL via INTRAVENOUS

## 2021-10-15 MED ORDER — PROPOFOL 10 MG/ML IV BOLUS
INTRAVENOUS | Status: DC | PRN
  Administered 2021-10-15: 80 mg via INTRAVENOUS
  Administered 2021-10-15: 20 mg via INTRAVENOUS

## 2021-10-15 NOTE — H&P (Signed)
Wyline Mood, MD [address], [address], [place], [place], [address] [address], [address], [place], [place], [address] Phone: [phone]  Fax: [phone]  Primary Care Physician:  Carlean Jews, PA-C   Pre-Procedure History & Physical: HPI:  Kelsey Sampson is a 51 y.o. female is here for an colonoscopy.   Past Medical History:  Diagnosis Date   Abscess of buttock, left    Hyperlipidemia    Hypertension    Tobacco use disorder 03/26/2021    Past Surgical History:  Procedure Laterality Date   ABDOMINAL HYSTERECTOMY     cyst removed Left    51 years old cyst removed    Prior to Admission medications   Medication Sig Start Date End Date Taking? Authorizing Provider  Ascorbic Acid (VITAMIN C PO) Take 1 tablet by mouth daily.   Yes [provider]  hydrochlorothiazide (HYDRODIURIL) 25 MG tablet Take 1 tablet (25 mg total) by mouth daily. 10/08/21 10/03/22 Yes McDonough, Salomon Fick, PA-C  Multiple Vitamin (MULTIVITAMIN WITH MINERALS) TABS tablet Take 1 tablet by mouth daily.   Yes [provider]  rosuvastatin (CRESTOR) 5 MG tablet Take 1 tablet (5 mg total) by mouth daily. 09/23/21  Yes McDonough, Lauren K, PA-C  traZODone (DESYREL) 50 MG tablet Take 1 tablet (50 mg total) by mouth at bedtime. 10/08/21  Yes McDonough, Lauren K, PA-C  ibuprofen (ADVIL) 200 MG tablet Take 600 mg by mouth every 6 (six) hours as needed for moderate pain or headache.    [provider]    Allergies as of 09/11/2021   (No Known Allergies)    Family History  Problem Relation Age of Onset   Hypertension Mother     Social History   Socioeconomic History   Marital status: Single    Spouse name: Not on file   Number of children: Not on file   Years of education: Not on file   Highest education level: Not on file  Occupational History   Not on file  Tobacco Use   Smoking status: Former    Types: Cigarettes    Quit date: 11/30/2020    Years since  quitting: 0.8   Smokeless tobacco: Never  Vaping Use   Vaping Use: Some days   Substances: Flavoring  Substance and Sexual Activity   Alcohol use: No   Drug use: Not Currently    Types: Marijuana    Comment: over 30 days ago   Sexual activity: Not Currently    Partners: Male  Other Topics Concern   Not on file  Social History Narrative   Not on file   Social Determinants of Health   Financial Resource Strain: Not on file  Food Insecurity: Not on file  Transportation Needs: Not on file  Physical Activity: Not on file  Stress: Not on file  Social Connections: Not on file  Intimate Partner Violence: Not At Risk (04/02/2021)   Humiliation, Afraid, Rape, and Kick questionnaire    Fear of Current or Ex-Partner: No    Emotionally Abused: No    Physically Abused: No    Sexually Abused: No    Review of Systems: See HPI, otherwise negative ROS  Physical Exam: BP (!) 147/110   Pulse 82   Temp (!) 96.5 F (35.8 C) (Temporal)   Resp 18   Ht 5\' 9"  (1.753 m)   Wt 121.4 kg   SpO2 98%   BMI 39.51 kg/m  General:   Alert,  pleasant  and cooperative in NAD Head:  Normocephalic and atraumatic. Neck:  Supple; no masses or thyromegaly. Lungs:  Clear throughout to auscultation, normal respiratory effort.    Heart:  +S1, +S2, Regular rate and rhythm, No edema. Abdomen:  Soft, nontender and nondistended. Normal bowel sounds, without guarding, and without rebound.   Neurologic:  Alert and  oriented x4;  grossly normal neurologically.  Impression/Plan: Kelsey Sampson is here for an colonoscopy to be performed for Screening colonoscopy average risk   Risks, benefits, limitations, and alternatives regarding  colonoscopy have been reviewed with the patient.  Questions have been answered.  All parties agreeable.   Wyline Mood, MD  10/15/2021, 8:37 AM

## 2021-10-15 NOTE — Op Note (Signed)
[facility] Patient Name: Kelsey Sampson Procedure Date: 10/15/2021 8:41 AM MRN: [id number] Account #: [id number] Date of Birth: 1970-12-16 Admit Type: Outpatient Age: 51 Room: [facility] ENDO ROOM 3 Gender: Female Note Status: Finalized Instrument Name: Prentice Docker [id number] Procedure:             Colonoscopy Indications:           Screening for colorectal malignant neoplasm Providers:             Wyline Mood MD, MD Referring MD:          No Local Md, MD (Referring MD) Medicines:             Monitored Anesthesia Care Complications:         No immediate complications. Procedure:             Pre-Anesthesia Assessment:                        - Prior to the procedure, a History and Physical was                         performed, and patient medications, allergies and                         sensitivities were reviewed. The patient's tolerance                         of previous anesthesia was reviewed.                        - The risks and benefits of the procedure and the                         sedation options and risks were discussed with the                         patient. All questions were answered and informed                         consent was obtained.                        - ASA Grade Assessment: II - A patient with mild                         systemic disease.                        After obtaining informed consent, the colonoscope was                         passed under direct vision. Throughout the procedure,                         the patient's blood pressure, pulse, and oxygen                         saturations were monitored continuously. The                         Colonoscope was  introduced through the anus and                         advanced to the the cecum, identified by the                         appendiceal orifice. The colonoscopy was performed                         with ease. The patient tolerated the procedure well.                          The quality of the bowel preparation was excellent. Findings:      The perianal and digital rectal examinations were normal.      A few small-mouthed diverticula were found in the entire colon.      The exam was otherwise without abnormality on direct and retroflexion       views. Impression:            - Diverticulosis in the entire examined colon.                        - The examination was otherwise normal on direct and                         retroflexion views.                        - No specimens collected. Recommendation:        - Discharge patient to home (with escort).                        - Resume previous diet.                        - Continue present medications.                        - Repeat colonoscopy in 10 years for screening                         purposes. Procedure Code(s):     --- Professional ---                        [phone], Colonoscopy, flexible; diagnostic, including                         collection of specimen(s) by brushing or washing, when                         performed (separate procedure) Diagnosis Code(s):     --- Professional ---                        Z12.11, Encounter for screening for malignant neoplasm                         of colon                        K57.30, Diverticulosis of large  intestine without                         perforation or abscess without bleeding CPT copyright 2019 [employer]. All rights reserved. The codes documented in this report are preliminary and upon coder review may  be revised to meet current compliance requirements. Wyline Mood, MD Wyline Mood MD, MD 10/15/2021 9:05:41 AM This report has been signed electronically. Number of Addenda: 0 Note Initiated On: 10/15/2021 8:41 AM Scope Withdrawal Time: 0 hours 9 minutes 33 seconds  Total Procedure Duration: 0 hours 11 minutes 24 seconds  Estimated Blood Loss:  Estimated blood loss: none.      [facility]

## 2021-10-15 NOTE — Anesthesia Preprocedure Evaluation (Signed)
Anesthesia Evaluation  Patient identified by MRN, date of birth, ID band Patient awake    Reviewed: Allergy & Precautions, H&P , NPO status , Patient's Chart, lab work & pertinent test results, reviewed documented beta blocker date and time   Airway Mallampati: II   Neck ROM: full    Dental  (+) Poor Dentition   Pulmonary neg pulmonary ROS, former smoker,    Pulmonary exam normal        Cardiovascular Exercise Tolerance: Good hypertension, On Medications negative cardio ROS Normal cardiovascular exam Rhythm:regular Rate:Normal     Neuro/Psych negative neurological ROS  negative psych ROS   GI/Hepatic negative GI ROS, Neg liver ROS,   Endo/Other  Morbid obesity  Renal/GU negative Renal ROS  negative genitourinary   Musculoskeletal   Abdominal   Peds  Hematology negative hematology ROS (+)   Anesthesia Other Findings Past Medical History: No date: Abscess of buttock, left No date: Hyperlipidemia No date: Hypertension 03/26/2021: Tobacco use disorder Past Surgical History: No date: ABDOMINAL HYSTERECTOMY No date: cyst removed; Left     Comment:  51 years old cyst removed BMI    Body Mass Index: 39.51 kg/m     Reproductive/Obstetrics negative OB ROS                             Anesthesia Physical Anesthesia Plan  ASA: 3  Anesthesia Plan: General   Post-op Pain Management:    Induction:   PONV Risk Score and Plan:   Airway Management Planned:   Additional Equipment:   Intra-op Plan:   Post-operative Plan:   Informed Consent: I have reviewed the patients History and Physical, chart, labs and discussed the procedure including the risks, benefits and alternatives for the proposed anesthesia with the patient or authorized representative who has indicated his/her understanding and acceptance.     Dental Advisory Given  Plan Discussed with: CRNA  Anesthesia Plan  Comments:         Anesthesia Quick Evaluation

## 2021-10-15 NOTE — Anesthesia Postprocedure Evaluation (Signed)
Anesthesia Post Note  Patient: Kelsey Sampson  Procedure(s) Performed: COLONOSCOPY WITH PROPOFOL  Patient location during evaluation: PACU Anesthesia Type: General Level of consciousness: awake and alert Pain management: pain level controlled Vital Signs Assessment: post-procedure vital signs reviewed and stable Respiratory status: spontaneous breathing, nonlabored ventilation, respiratory function stable and patient connected to nasal cannula oxygen Cardiovascular status: blood pressure returned to baseline and stable Postop Assessment: no apparent nausea or vomiting Anesthetic complications: no   No notable events documented.   Last Vitals:  Vitals:   10/15/21 0908 10/15/21 0918  BP: 110/74 118/84  Pulse:  69  Resp: 17   Temp: (!) 35.9 C   SpO2:  99%    Last Pain:  Vitals:   10/15/21 0928  TempSrc:   PainSc: 0-No pain                 Yevette Edwards

## 2021-10-15 NOTE — Transfer of Care (Signed)
Immediate Anesthesia Transfer of Care Note  Patient: Kelsey Sampson  Procedure(s) Performed: COLONOSCOPY WITH PROPOFOL  Patient Location: PACU  Anesthesia Type:General  Level of Consciousness: awake, alert  and oriented  Airway & Oxygen Therapy: Patient Spontanous Breathing  Post-op Assessment: Report given to RN and Post -op Vital signs reviewed and stable  Post vital signs: Reviewed and stable  Last Vitals:  Vitals Value Taken Time  BP 110/74 10/15/21 0908  Temp 35.9 C 10/15/21 0908  Pulse 80 10/15/21 0911  Resp 15 10/15/21 0911  SpO2 100 % 10/15/21 0911  Vitals shown include unvalidated device data.  Last Pain:  Vitals:   10/15/21 0908  TempSrc: Temporal  PainSc: 0-No pain         Complications: No notable events documented.

## 2021-10-16 ENCOUNTER — Encounter: Payer: Self-pay | Admitting: Gastroenterology

## 2021-11-02 ENCOUNTER — Encounter: Payer: Self-pay | Admitting: Physician Assistant

## 2021-11-02 ENCOUNTER — Ambulatory Visit (INDEPENDENT_AMBULATORY_CARE_PROVIDER_SITE_OTHER): Payer: 59 | Admitting: Physician Assistant

## 2021-11-02 VITALS — BP 118/83 | HR 94 | Temp 98.3°F | Resp 16 | Ht 69.0 in | Wt 267.5 lb

## 2021-11-02 DIAGNOSIS — K436 Other and unspecified ventral hernia with obstruction, without gangrene: Secondary | ICD-10-CM | POA: Diagnosis not present

## 2021-11-02 DIAGNOSIS — Z01419 Encounter for gynecological examination (general) (routine) without abnormal findings: Secondary | ICD-10-CM | POA: Diagnosis not present

## 2021-11-02 DIAGNOSIS — Z0001 Encounter for general adult medical examination with abnormal findings: Secondary | ICD-10-CM | POA: Diagnosis not present

## 2021-11-02 DIAGNOSIS — I10 Essential (primary) hypertension: Secondary | ICD-10-CM

## 2021-11-02 DIAGNOSIS — R300 Dysuria: Secondary | ICD-10-CM

## 2021-11-02 DIAGNOSIS — G4709 Other insomnia: Secondary | ICD-10-CM | POA: Diagnosis not present

## 2021-11-02 DIAGNOSIS — Z113 Encounter for screening for infections with a predominantly sexual mode of transmission: Secondary | ICD-10-CM

## 2021-11-02 DIAGNOSIS — Z124 Encounter for screening for malignant neoplasm of cervix: Secondary | ICD-10-CM

## 2021-11-02 DIAGNOSIS — R69 Illness, unspecified: Secondary | ICD-10-CM | POA: Diagnosis not present

## 2021-11-02 DIAGNOSIS — R7303 Prediabetes: Secondary | ICD-10-CM

## 2021-11-02 DIAGNOSIS — E782 Mixed hyperlipidemia: Secondary | ICD-10-CM | POA: Diagnosis not present

## 2021-11-02 NOTE — Progress Notes (Signed)
[facility] [place], [place] [address]  Internal MEDICINE  Office Visit Note  Patient Name: Kelsey Sampson  [id number]  [id number]  Date of Service: 11/06/2021  Chief Complaint  Patient presents with   Annual Exam   Hyperlipidemia   Hypertension     HPI Pt is here for routine health maintenance examination and has no new concerns today -She recently had her colonoscopy and mammogram -Bp stable -sleeping better with trazodone -still working regularly -still needs to check about tdap, will have second dose of shingles vaccine when time -Will have visit with GS for surgical planning for hernia repair  Current Medication: Outpatient Encounter Medications as of 11/02/2021  Medication Sig   Ascorbic Acid (VITAMIN C PO) Take 1 tablet by mouth daily.   hydrochlorothiazide (HYDRODIURIL) 25 MG tablet Take 1 tablet (25 mg total) by mouth daily.   ibuprofen (ADVIL) 200 MG tablet Take 600 mg by mouth every 6 (six) hours as needed for moderate pain or headache.   Multiple Vitamin (MULTIVITAMIN WITH MINERALS) TABS tablet Take 1 tablet by mouth daily.   rosuvastatin (CRESTOR) 5 MG tablet Take 1 tablet (5 mg total) by mouth daily.   traZODone (DESYREL) 50 MG tablet Take 1 tablet (50 mg total) by mouth at bedtime.   No facility-administered encounter medications on file as of 11/02/2021.    Surgical History: Past Surgical History:  Procedure Laterality Date   ABDOMINAL HYSTERECTOMY     COLONOSCOPY WITH PROPOFOL N/A 10/15/2021   Procedure: COLONOSCOPY WITH PROPOFOL;  Surgeon: Jonathon Bellows, MD;  Location: [facility] ENDOSCOPY;  Service: Gastroenterology;  Laterality: N/A;   cyst removed Left    51 years old cyst removed    Medical History: Past Medical History:  Diagnosis Date   Abscess of buttock, left    Hyperlipidemia    Hypertension    Tobacco use disorder 03/26/2021    Family History: Family History  Problem Relation Age of Onset   Hypertension Mother        Review of Systems  Constitutional:  Negative for chills, fatigue and unexpected weight change.  HENT:  Negative for congestion, postnasal drip, rhinorrhea, sneezing and sore throat.   Eyes:  Negative for redness.  Respiratory:  Negative for cough, chest tightness and shortness of breath.   Cardiovascular:  Negative for chest pain and palpitations.  Gastrointestinal:  Negative for abdominal pain, constipation, diarrhea, nausea and vomiting.  Genitourinary:  Negative for dysuria and frequency.  Musculoskeletal:  Negative for back pain, joint swelling and neck pain.  Skin:  Negative for rash.  Neurological: Negative.  Negative for tremors and numbness.  Hematological:  Negative for adenopathy. Does not bruise/bleed easily.  Psychiatric/Behavioral:  Negative for behavioral problems (Depression) and suicidal ideas. The patient is not nervous/anxious.      Vital Signs: BP 118/83   Pulse 94   Temp 98.3 F (36.8 C)   Resp 16   Ht _0  (1.753 m)   Wt 267 lb 8 oz (121.3 kg)   SpO2 100%   BMI 39.50 kg/m    Physical Exam Vitals and nursing note reviewed. Exam conducted with a chaperone present.  Constitutional:      General: She is not in acute distress.    Appearance: She is well-developed. She is obese. She is not diaphoretic.  HENT:     Head: Normocephalic and atraumatic.     Mouth/Throat:     Pharynx: No oropharyngeal exudate.  Eyes:     Pupils:  Pupils are equal, round, and reactive to light.  Neck:     Thyroid: No thyromegaly.     Vascular: No JVD.     Trachea: No tracheal deviation.  Cardiovascular:     Rate and Rhythm: Normal rate and regular rhythm.     Heart sounds: Normal heart sounds. No murmur heard.    No friction rub. No gallop.  Pulmonary:     Effort: Pulmonary effort is normal. No respiratory distress.     Breath sounds: No wheezing or rales.  Chest:     Chest wall: No tenderness.  Breasts:    Right: Normal. No mass.     Left: Normal. No mass.   Abdominal:     General: Bowel sounds are normal.     Palpations: Abdomen is soft.     Tenderness: There is no abdominal tenderness.  Genitourinary:    Exam position: Lithotomy position.     Vagina: Vaginal discharge present.     Comments: Pap performed Musculoskeletal:        General: Normal range of motion.     Cervical back: Normal range of motion and neck supple.  Lymphadenopathy:     Cervical: No cervical adenopathy.  Skin:    General: Skin is warm and dry.  Neurological:     Mental Status: She is alert and oriented to person, place, and time.     Cranial Nerves: No cranial nerve deficit.  Psychiatric:        Behavior: Behavior normal.        Thought Content: Thought content normal.        Judgment: Judgment normal.      LABS: Recent Results (from the past 2160 hour(s))  CBC w/Diff/Platelet     Status: None   Collection Time: 09/18/21  8:44 AM  Result Value Ref Range   WBC 8.5 3.4 - 10.8 x10E3/uL   RBC 5.01 3.77 - 5.28 x10E6/uL   Hemoglobin 14.8 11.1 - 15.9 g/dL   Hematocrit 44.1 34.0 - 46.6 %   MCV 88 79 - 97 fL   MCH 29.5 26.6 - 33.0 pg   MCHC 33.6 31.5 - 35.7 g/dL   RDW 13.4 11.7 - 15.4 %   Platelets 214 150 - 450 x10E3/uL   Neutrophils 58 Not Estab. %   Lymphs 33 Not Estab. %   Monocytes 7 Not Estab. %   Eos 2 Not Estab. %   Basos 0 Not Estab. %   Neutrophils Absolute 5.0 1.4 - 7.0 x10E3/uL   Lymphocytes Absolute 2.8 0.7 - 3.1 x10E3/uL   Monocytes Absolute 0.6 0.1 - 0.9 x10E3/uL   EOS (ABSOLUTE) 0.2 0.0 - 0.4 x10E3/uL   Basophils Absolute 0.0 0.0 - 0.2 x10E3/uL   Immature Granulocytes 0 Not Estab. %   Immature Grans (Abs) 0.0 0.0 - 0.1 x10E3/uL  Comprehensive metabolic panel     Status: Abnormal   Collection Time: 09/18/21  8:44 AM  Result Value Ref Range   Glucose 116 (H) 70 - 99 mg/dL   BUN 10 6 - 24 mg/dL   Creatinine, Ser 0.83 0.57 - 1.00 mg/dL   eGFR 86 >59 mL/min/1.73   BUN/Creatinine Ratio 12 9 - 23   Sodium 142 134 - 144 mmol/L    Potassium 4.6 3.5 - 5.2 mmol/L   Chloride 105 96 - 106 mmol/L   CO2 22 20 - 29 mmol/L   Calcium 9.6 8.7 - 10.2 mg/dL   Total Protein 7.3 6.0 - 8.5 g/dL  Albumin 4.2 3.9 - 4.9 g/dL   Globulin, Total 3.1 1.5 - 4.5 g/dL   Albumin/Globulin Ratio 1.4 1.2 - 2.2   Bilirubin Total 0.4 0.0 - 1.2 mg/dL   Alkaline Phosphatase 141 (H) 44 - 121 IU/L   AST 14 0 - 40 IU/L   ALT 14 0 - 32 IU/L  TSH + free T4     Status: None   Collection Time: 09/18/21  8:44 AM  Result Value Ref Range   TSH 2.150 0.450 - 4.500 uIU/mL   Free T4 1.10 0.82 - 1.77 ng/dL  Lipid Panel With LDL/HDL Ratio     Status: Abnormal   Collection Time: 09/18/21  8:44 AM  Result Value Ref Range   Cholesterol, Total 220 (H) 100 - 199 mg/dL   Triglycerides 242 (H) 0 - 149 mg/dL   HDL 35 (L) >39 mg/dL   VLDL Cholesterol Cal 44 (H) 5 - 40 mg/dL   LDL Chol Calc (NIH) 141 (H) 0 - 99 mg/dL   LDL/HDL Ratio 4.0 (H) 0.0 - 3.2 ratio    Comment:                                     LDL/HDL Ratio                                             Men  Women                               1/2 Avg.Risk  1.0    1.5                                   Avg.Risk  3.6    3.2                                2X Avg.Risk  6.2    5.0                                3X Avg.Risk  8.0    6.1   B12 and Folate Panel     Status: None   Collection Time: 09/18/21  8:44 AM  Result Value Ref Range   Vitamin B-12 409 232 - 1,245 pg/mL   Folate >20.0 >3.0 ng/mL    Comment: A serum folate concentration of less than 3.1 ng/mL is considered to represent clinical deficiency.   VITAMIN D 25 Hydroxy (Vit-D Deficiency, Fractures)     Status: Abnormal   Collection Time: 09/18/21  8:44 AM  Result Value Ref Range   Vit D, 25-Hydroxy 26.7 (L) 30.0 - 100.0 ng/mL    Comment: Vitamin D deficiency has been defined by the [place] practice guideline as a level of serum 25-OH vitamin D less than 20 ng/mL (1,2). The Endocrine Society went on to  further define vitamin D insufficiency as a level between 21 and 29 ng/mL (2). 1. IOM (Institute of Medicine). 2010. Dietary reference    intakes for calcium and D. [place]:  The    [employer]. 2. Holick MF, Binkley [place], Bischoff-Ferrari HA, et al.    Evaluation, treatment, and prevention of vitamin D    deficiency: an Endocrine Society clinical practice    guideline. JCEM. 2011 Jul; 96(7):1911-30.   POCT HgB A1C     Status: Abnormal   Collection Time: 10/08/21  4:14 PM  Result Value Ref Range   Hemoglobin A1C 6.2 (A) 4.0 - 5.6 %   HbA1c POC (<> result, manual entry)     HbA1c, POC (prediabetic range)     HbA1c, POC (controlled diabetic range)    UA/M w/rflx Culture, Routine     Status: Abnormal   Collection Time: 11/02/21  3:37 PM   Specimen: Urine   Urine  Result Value Ref Range   Specific Gravity, UA 1.020 1.005 - 1.030   pH, UA 5.5 5.0 - 7.5   Color, UA Yellow Yellow   Appearance Ur Clear Clear   Leukocytes,UA Negative Negative   Protein,UA Negative Negative/Trace   Glucose, UA Negative Negative   Ketones, UA Trace (A) Negative   RBC, UA Negative Negative   Bilirubin, UA Negative Negative   Urobilinogen, Ur 0.2 0.2 - 1.0 mg/dL   Nitrite, UA Negative Negative   Microscopic Examination Comment     Comment: Microscopic follows if indicated.   Microscopic Examination See below:     Comment: Microscopic was indicated and was performed.   Urinalysis Reflex Comment     Comment: This specimen will not reflex to a Urine Culture.  IGP, Aptima HPV     Status: None   Collection Time: 11/02/21  3:37 PM  Result Value Ref Range   Interpretation NILM     Comment: NEGATIVE FOR INTRAEPITHELIAL LESION OR MALIGNANCY.   Category NIL     Comment: Negative for Intraepithelial Lesion   Adequacy SDER     Comment: Satisfactory for evaluation.   Clinician Provided ICD10 Comment     Comment: Z12.4   Performed by: Comment     Comment: Shanon Brow, Cytotechnologist  (ASCP)   Note: Comment     Comment: The Pap smear is a screening test designed to aid in the detection of premalignant and malignant conditions of the uterine cervix.  It is not a diagnostic procedure and should not be used as the sole means of detecting cervical cancer.  Both false-positive and false-negative reports do occur.    Test Methodology Comment     Comment: This liquid based ThinPrep(R) pap test was screened with the use of an image guided system.    HPV Aptima Negative Negative    Comment: This nucleic acid amplification test detects fourteen high-risk HPV types (16,18,31,33,35,39,45,51,52,56,58,59,66,68) without differentiation.   NuSwab Vaginitis Plus (VG+)     Status: None   Collection Time: 11/02/21  3:37 PM  Result Value Ref Range   Atopobium vaginae Low - 0 Score   BVAB 2 Low - 0 Score   Megasphaera 1 Low - 0 Score    Comment: Calculate total score by adding the 3 individual bacterial vaginosis (BV) marker scores together.  Total score is interpreted as follows: Total score 0-1: Indicates the absence of BV. Total score   2: Indeterminate for BV. Additional clinical                  data should be evaluated to establish a                  diagnosis. Total score  3-6: Indicates the presence of BV. This test was developed and its performance characteristics determined by [facility].  It has not been cleared or approved by the Food and Drug Administration.    Candida albicans, NAA Negative Negative   Candida glabrata, NAA Negative Negative   Trich vag by NAA Negative Negative   Chlamydia trachomatis, NAA Negative Negative   Neisseria gonorrhoeae, NAA Negative Negative  Microscopic Examination     Status: None   Collection Time: 11/02/21  3:37 PM   Urine  Result Value Ref Range   WBC, UA None seen 0 - 5 /hpf   RBC, Urine 0-2 0 - 2 /hpf   Epithelial Cells (non renal) 0-10 0 - 10 /hpf   Casts None seen None seen /lpf   Bacteria, UA None seen None seen/Few        Assessment/Plan: 1. Encounter for general adult medical examination with abnormal findings CPE performed, UTD on PHM, labs already reviewed  2. Essential hypertension Stable, continue current medication  3. Prediabetes Too soon to recheck A1c, will continue to work on diet and exercise  4. Other insomnia Improving, may continue trazodone  5. Incarcerated ventral hernia Followed by GS  6. Mixed hyperlipidemia Continue crestor  7. Routine cervical smear - IGP, Aptima HPV  8. Screening for STDs (sexually transmitted diseases) - NuSwab Vaginitis Plus (VG+)  9. Dysuria - UA/M w/rflx Culture, Routine - Microscopic Examination  10. Visit for gynecologic examination Breast and pelvic exams performed  General Counseling: Jenna verbalizes understanding of the findings of todays visit and agrees with plan of treatment. I have discussed any further diagnostic evaluation that may be needed or ordered today. We also reviewed her medications today. she has been encouraged to call the office with any questions or concerns that should arise related to todays visit.    Counseling:    Orders Placed This Encounter  Procedures   Microscopic Examination   UA/M w/rflx Culture, Routine   NuSwab Vaginitis Plus (VG+)    No orders of the defined types were placed in this encounter.   This patient was seen by Drema Dallas, PA-C in collaboration with Dr. Clayborn Bigness as a part of collaborative care agreement.  Total time spent:35 Minutes  Time spent includes review of chart, medications, test results, and follow up plan with the patient.     Lavera Guise, MD  Internal Medicine

## 2021-11-03 LAB — MICROSCOPIC EXAMINATION
Bacteria, UA: NONE SEEN
Casts: NONE SEEN /lpf
WBC, UA: NONE SEEN /hpf (ref 0–5)

## 2021-11-03 LAB — UA/M W/RFLX CULTURE, ROUTINE
Bilirubin, UA: NEGATIVE
Glucose, UA: NEGATIVE
Leukocytes,UA: NEGATIVE
Nitrite, UA: NEGATIVE
Protein,UA: NEGATIVE
RBC, UA: NEGATIVE
Specific Gravity, UA: 1.02 (ref 1.005–1.030)
Urobilinogen, Ur: 0.2 mg/dL (ref 0.2–1.0)
pH, UA: 5.5 (ref 5.0–7.5)

## 2021-11-04 LAB — NUSWAB VAGINITIS PLUS (VG+)
Candida albicans, NAA: NEGATIVE
Candida glabrata, NAA: NEGATIVE
Chlamydia trachomatis, NAA: NEGATIVE
Neisseria gonorrhoeae, NAA: NEGATIVE
Trich vag by NAA: NEGATIVE

## 2021-11-05 ENCOUNTER — Other Ambulatory Visit: Payer: Self-pay

## 2021-11-05 ENCOUNTER — Ambulatory Visit: Payer: Self-pay | Admitting: Surgery

## 2021-11-05 ENCOUNTER — Encounter: Payer: Self-pay | Admitting: Surgery

## 2021-11-05 ENCOUNTER — Ambulatory Visit (INDEPENDENT_AMBULATORY_CARE_PROVIDER_SITE_OTHER): Payer: 59 | Admitting: Surgery

## 2021-11-05 VITALS — BP 124/85 | HR 86 | Temp 98.9°F | Ht 69.0 in | Wt 267.0 lb

## 2021-11-05 DIAGNOSIS — K436 Other and unspecified ventral hernia with obstruction, without gangrene: Secondary | ICD-10-CM | POA: Diagnosis not present

## 2021-11-05 NOTE — Progress Notes (Signed)
Patient ID: Kelsey Sampson, female   DOB: 10-02-1970, 51 y.o.   MRN: [id number]  Chief Complaint: Follow-up ventral hernia/morbid obesity  History of Present Illness No remarkable, measurable weight loss since her last visit.  She denies any increase in her pain, though the hernia bulge may be slightly larger.  She may need to engage in further exercise, however she believes she is adequately watching and limiting her diet and avoiding unhelpful calories.  She is still highly motivated to pursue weight loss.  No other complaints today. Previously: In the 32-month follow-up visit today, she is lost over 10 pounds.  She is delighted, she reported she was not checking her weight because she did not want to be disappointed.  She has quit smoking.  She reports her bowel habits are easy and regular. Previously Kelsey Sampson is a 51 y.o. female with a prior history of umbilical hernia repair as a child.  Redeveloped acute abdominal pain and was seen, evaluated and CT scan confirmed a fat filled supraumbilical abdominal wall hernia, whose "base" measures 1.8 cm.  No bowel involvement.  She reports a bulge that is tender on palpation.  She denies any vomiting, fevers and chills.  She reports some nausea, with normal bowel movements. She is a smoker and pain is exacerbated with her cough.  Desires to quit smoking, and feels it would not be an issue preop.  We also discussed other optimization including weight loss and she is motivated, but I do not believe we should defer her surgery until this happens.  Past Medical History Past Medical History:  Diagnosis Date   Abscess of buttock, left    Hyperlipidemia    Hypertension    Tobacco use disorder 03/26/2021      Past Surgical History:  Procedure Laterality Date   ABDOMINAL HYSTERECTOMY     COLONOSCOPY WITH PROPOFOL N/A 10/15/2021   Procedure: COLONOSCOPY WITH PROPOFOL;  Surgeon: Wyline Mood, MD;  Location: [facility] ENDOSCOPY;  Service: Gastroenterology;   Laterality: N/A;   cyst removed Left    51 years old cyst removed    No Known Allergies  Current Outpatient Medications  Medication Sig Dispense Refill   Ascorbic Acid (VITAMIN C PO) Take 1 tablet by mouth daily.     hydrochlorothiazide (HYDRODIURIL) 25 MG tablet Take 1 tablet (25 mg total) by mouth daily. 90 tablet 3   ibuprofen (ADVIL) 200 MG tablet Take 600 mg by mouth every 6 (six) hours as needed for moderate pain or headache.     Multiple Vitamin (MULTIVITAMIN WITH MINERALS) TABS tablet Take 1 tablet by mouth daily.     Na Sulfate-K Sulfate-Mg Sulf 17.5-3.13-1.6 GM/177ML SOLN Take by mouth as directed.     rosuvastatin (CRESTOR) 5 MG tablet Take 1 tablet (5 mg total) by mouth daily. 30 tablet 1   traZODone (DESYREL) 50 MG tablet Take 1 tablet (50 mg total) by mouth at bedtime. 30 tablet 2   No current facility-administered medications for this visit.    Family History Family History  Problem Relation Age of Onset   Hypertension Mother       Social History Social History   Tobacco Use   Smoking status: Former    Types: Cigarettes    Quit date: 11/30/2020    Years since quitting: 0.9   Smokeless tobacco: Never  Vaping Use   Vaping Use: Some days   Substances: Flavoring  Substance Use Topics   Alcohol use: No   Drug use: Not Currently  Types: Marijuana    Comment: over 30 days ago        Review of Systems  Constitutional: Negative.   HENT: Negative.    Eyes: Negative.   Respiratory: Negative.    Cardiovascular: Negative.   Gastrointestinal:  Negative for abdominal pain and nausea.  Skin: Negative.   Neurological: Negative.   Psychiatric/Behavioral: Negative.        Physical Exam Blood pressure 124/85, pulse 86, temperature 98.9 F (37.2 C), temperature source Oral, height 5\' 9"  (1.753 m), weight 267 lb (121.1 kg). Last Weight  Most recent update: 11/05/2021  1:30 PM    Weight  121.1 kg (267 lb)             CONSTITUTIONAL: Well developed, and  nourished, appropriately responsive and aware without distress.   EYES: Sclera non-icteric.   EARS, NOSE, MOUTH AND THROAT: Mask worn.   The oropharynx is clear. Oral mucosa is pink and moist.   Hearing is intact to voice.  NECK: Trachea is midline, and there is no jugular venous distension.  LYMPH NODES:  Lymph nodes in the neck are not enlarged. RESPIRATORY:  Lungs are clear, and breath sounds are equal bilaterally. Normal respiratory effort without pathologic use of accessory muscles. CARDIOVASCULAR: Heart is regular in rate and rhythm. GI: The abdomen is notable for a supraumbilical mass that is non-tender to touch, partially-reducible limited by tenderness.  Her abdomen is otherwise soft, nontender, and nondistended. There were no other palpable masses. I did not appreciate hepatosplenomegaly. There were normal bowel sounds. MUSCULOSKELETAL:  Symmetrical muscle tone appreciated in all four extremities.    SKIN: Skin turgor is normal. No pathologic skin lesions appreciated.  NEUROLOGIC:  Motor and sensation appear grossly normal.  Cranial nerves are grossly without defect. PSYCH:  Alert and oriented to person, place and time. Affect is appropriate for situation.  Data Reviewed I have personally reviewed what is currently available of the patient's imaging, recent labs and medical records.   Labs:     Latest Ref Rng & Units 09/18/2021    8:44 AM 04/21/2021   10:49 AM 03/21/2021    7:33 AM  CBC  WBC 3.4 - 10.8 x10E3/uL 8.5  7.6  9.8   Hemoglobin 11.1 - 15.9 g/dL 03/23/2021  50.0  93.8   Hematocrit 34.0 - 46.6 % 44.1  44.9  46.8   Platelets 150 - 450 x10E3/uL 214  241  250       Latest Ref Rng & Units 09/18/2021    8:44 AM 04/21/2021   10:49 AM 03/21/2021    7:33 AM  CMP  Glucose 70 - 99 mg/dL 03/23/2021  993  716   BUN 6 - 24 mg/dL 10  12  11    Creatinine 0.57 - 1.00 mg/dL 967   8.93   Sodium 134 - 144 mmol/L 142  136  140   Potassium 3.5 - 5.2 mmol/L 4.6  3.7  4.1   Chloride 96 - 106  mmol/L 105  103  107   CO2 20 - 29 mmol/L 22  27  25    Calcium 8.7 - 10.2 mg/dL 9.6  9.2  9.3   Total Protein 6.0 - 8.5 g/dL 7.3  7.8  7.6   Total Bilirubin 0.0 - 1.2 mg/dL 0.4  0.6  0.5   Alkaline Phos 44 - 121 IU/L 141  119  113   AST 0 - 40 IU/L 14  22  18    ALT 0 - 32  IU/L 14  27  19        Imaging: Radiology review:  ADDENDUM REPORT: 03/21/2021 10:38   ADDENDUM: The midline hernia is actually supraumbilical, lying 3 cm above the umbilicus. This was not present on the prior CT.     Electronically Signed   By: 03/23/2021 M.D.   On: 03/21/2021 10:38    Addended by 03/23/2021, MD on 03/21/2021 10:40 AM   Study Result  Narrative & Impression  CLINICAL DATA:  Triage note: Pt via POV from home. Pt c/o lower abd pain that radiates to her back that started yesterday, pt noted to have a umbilical hernia that she has not been dx with. States that it hurts her when she coughs. Denies NVD. Pt A&OX4 and NAD.   EXAM: CT ABDOMEN AND PELVIS WITH CONTRAST   TECHNIQUE: Multidetector CT imaging of the abdomen and pelvis was performed using the standard protocol following bolus administration of intravenous contrast.   RADIATION DOSE REDUCTION: This exam was performed according to the departmental dose-optimization program which includes automated exposure control, adjustment of the mA and/or kV according to patient size and/or use of iterative reconstruction technique.   CONTRAST:  03/23/2021 OMNIPAQUE IOHEXOL 300 MG/ML  SOLN   COMPARISON:  04/28/2013   FINDINGS: Lower chest: No acute abnormality.   Hepatobiliary: Liver normal in size and overall attenuation. There several low-density liver lesions, largest in the posterior aspect of segment 2, 1.8 cm, all consistent with cysts. No other liver lesions. Gallbladder is distended. There is hypoattenuating material, higher in attenuation than fluid, within the gallbladder, consistent with sludge. No radiopaque stones. No wall  thickening or inflammation. No bile duct dilation.   Pancreas: Unremarkable. No pancreatic ductal dilatation or surrounding inflammatory changes.   Spleen: Normal in size without focal abnormality.   Adrenals/Urinary Tract: Adrenal glands are unremarkable. Kidneys are normal, without renal calculi, focal lesion, or hydronephrosis. Bladder is unremarkable.   Stomach/Bowel: Normal stomach. Small bowel and colon are normal in caliber. No wall thickening or inflammation. There is fat attenuation along the wall of the colon, nonspecific, finding that can be seen as result of remote inflammation. There are scattered left colon diverticula without diverticulitis. Normal appendix visualized.   Vascular/Lymphatic: Mild aortic atherosclerosis. No aneurysm. No enlarged lymph nodes.   Reproductive: Uterus and bilateral adnexa are unremarkable.   Other: Fat containing umbilical hernia base measuring 1.8 cm. No bowel enters this. Herniated fat is surrounded by a rim of increased attenuation and mild haziness in the adjacent subcutaneous fat suggesting inflammation.   No ascites.   Musculoskeletal: No fracture or acute finding.  No bone lesion.   IMPRESSION: 1. Small fat containing umbilical hernia associated with mild inflammatory changes. No bowel enters the hernia. 2. No other evidence of an acute abnormality. 3. Left colon diverticula without evidence of diverticulitis. 4. Aortic atherosclerosis.   Electronically Signed: By: 06/28/2013 M.D. On: 03/21/2021 10:14   Within last 24 hrs: No results found.  Assessment     Patient Active Problem List   Diagnosis Date Noted   Morbid obesity (HCC) 03/26/2021   Incarcerated ventral hernia 03/26/2021    Plan    As she continues to be highly motivated to optimize her potential for a primary repair without utilization of mesh, we will continue to expect this trend of weight loss to continue.  She desires to continue with this game  plan we will see her back in 3 months.  Face-to-face time spent with  the patient and accompanying care providers(if present) was 20 minutes, with more than 50% of the time spent counseling, educating, and coordinating care of the patient.    These notes generated with voice recognition software. I apologize for typographical errors.  Campbell Lerner M.D., FACS 11/05/2021, 3:43 PM

## 2021-11-05 NOTE — Patient Instructions (Signed)
Umbilical Hernia, Adult  A hernia is a bulge of tissue that pushes through an opening between muscles. An umbilical hernia happens in the abdomen, near the belly button (umbilicus). The hernia may contain tissues from the small intestine, large intestine, or fatty tissue covering the intestines. Umbilical hernias in adults tend to get worse over time, and they require surgical treatment. There are different types of umbilical hernias, including: Indirect hernia. This type is located just above or below the umbilicus. It is the most common type of umbilical hernia in adults. Direct hernia. This type forms through an opening formed by the umbilicus. Reducible hernia. This type of hernia comes and goes. It may be visible only when you strain, lift something heavy, or cough. This type of hernia can be pushed back into the abdomen (reduced). Incarcerated hernia. This type traps abdominal tissue inside the hernia. This type of hernia cannot be reduced. Strangulated hernia. This type of hernia cuts off blood flow to the tissues inside the hernia. The tissues can start to die if this happens. This type of hernia requires emergency treatment. What are the causes? An umbilical hernia happens when tissue inside the abdomen presses on a weak area of the abdominal muscles. What increases the risk? You may have a greater risk of this condition if you: Are obese. Have had several pregnancies. Have a buildup of fluid inside your abdomen. Have had surgery that weakens the abdominal muscles. What are the signs or symptoms? The main symptom of this condition is a painless bulge at or near the belly button. A reducible hernia may be visible only when you strain, lift something heavy, or cough. Other symptoms may include: Dull pain. A feeling of pressure. Symptoms of a strangulated hernia may include: Pain that gets increasingly worse. Nausea and vomiting. Pain when pressing on the hernia. Skin over the hernia  becoming red or purple. Constipation. Blood in the stool. How is this diagnosed? This condition may be diagnosed based on: A physical exam. You may be asked to cough or strain while standing. These actions increase the pressure inside your abdomen and can force the hernia through the opening in your muscles. Your health care provider may try to reduce the hernia by pressing on it. Your symptoms and medical history. How is this treated? Surgery is the only treatment for an umbilical hernia. Surgery for a strangulated hernia is done as soon as possible. If you have a small hernia that is not incarcerated, you may need to lose weight before having surgery. Follow these instructions at home: Lose weight, if told by your health care provider. Do not try to push the hernia back in. Watch your hernia for any changes in color or size. Tell your health care provider if any changes occur. You may need to avoid activities that increase pressure on your hernia. Do not lift anything that is heavier than 10 lb (4.5 kg), or the limit that you are told, until your health care provider says that it is safe. Take over-the-counter and prescription medicines only as told by your health care provider. Keep all follow-up visits. This is important. Contact a health care provider if: Your hernia gets larger. Your hernia becomes painful. Get help right away if: You develop sudden, severe pain near the area of your hernia. You have pain as well as nausea or vomiting. You have pain and the skin over your hernia changes color. You develop a fever or chills. Summary A hernia is a bulge of   tissue that pushes through an opening between muscles. An umbilical hernia happens near the belly button. Surgery is the only treatment for an umbilical hernia. Do not try to push your hernia back in. Keep all follow-up visits. This is important. This information is not intended to replace advice given to you by your health care  provider. Make sure you discuss any questions you have with your health care provider. Document Revised: 09/17/2019 Document Reviewed: 09/17/2019 Elsevier Patient Education  2023 [employer] Inc[employer]  

## 2021-11-06 ENCOUNTER — Telehealth: Payer: Self-pay

## 2021-11-06 LAB — IGP, APTIMA HPV: HPV Aptima: NEGATIVE

## 2021-11-06 NOTE — Telephone Encounter (Signed)
-----   Message from Carlean Jews, PA-C sent at 11/06/2021 12:17 PM EDT ----- Please let her know her pap was normal

## 2021-11-06 NOTE — Telephone Encounter (Signed)
Pt advised pap is normal 

## 2021-11-14 ENCOUNTER — Other Ambulatory Visit: Payer: Self-pay | Admitting: Physician Assistant

## 2021-12-01 ENCOUNTER — Telehealth: Payer: Self-pay | Admitting: Nurse Practitioner

## 2021-12-01 ENCOUNTER — Encounter: Payer: Self-pay | Admitting: Nurse Practitioner

## 2021-12-01 ENCOUNTER — Ambulatory Visit (INDEPENDENT_AMBULATORY_CARE_PROVIDER_SITE_OTHER): Payer: 59 | Admitting: Nurse Practitioner

## 2021-12-01 VITALS — BP 147/93 | HR 88 | Temp 98.3°F | Resp 16 | Ht 69.0 in | Wt 270.0 lb

## 2021-12-01 DIAGNOSIS — M25511 Pain in right shoulder: Secondary | ICD-10-CM | POA: Diagnosis not present

## 2021-12-01 DIAGNOSIS — M7501 Adhesive capsulitis of right shoulder: Secondary | ICD-10-CM | POA: Diagnosis not present

## 2021-12-01 MED ORDER — IBUPROFEN 800 MG PO TABS: 800.0000 mg | ORAL_TABLET | Freq: Three times a day (TID) | ORAL | 0 refills | Status: DC

## 2021-12-01 NOTE — Progress Notes (Signed)
[facility] [address] [place], [place] [address]  Internal MEDICINE  Office Visit Note  Patient Name: Kelsey Sampson  [id number]  [id number]  Date of Service: 12/01/2021  Chief Complaint  Patient presents with   Acute Visit    Right hand swollen, tingling from shoulder down to hand.      HPI Roxanna presents for an acute sick visit for swollen right hand and right shoulder pain.  --Tender right shoulder --Swelling of right hand  --Numbness that radiates down entire arm --Increased weakness right arm --Decreased grip strength  --Significantly decreased ROM of shoulder elbow and hand and wrist --Taking tylenol generic not helping Onset was gradual over the past few weeks.   Current Medication:  Outpatient Encounter Medications as of 12/01/2021  Medication Sig   Ascorbic Acid (VITAMIN C PO) Take 1 tablet by mouth daily.   hydrochlorothiazide (HYDRODIURIL) 25 MG tablet Take 1 tablet (25 mg total) by mouth daily.   ibuprofen (ADVIL) 200 MG tablet Take 600 mg by mouth every 6 (six) hours as needed for moderate pain or headache.   ibuprofen (ADVIL) 800 MG tablet Take 1 tablet (800 mg total) by mouth every 8 (eight) hours. For 3-5 days then every 8 hours as needed for moderate to severe pain   Multiple Vitamin (MULTIVITAMIN WITH MINERALS) TABS tablet Take 1 tablet by mouth daily.   Na Sulfate-K Sulfate-Mg Sulf 17.5-3.13-1.6 GM/177ML SOLN Take by mouth as directed.   rosuvastatin (CRESTOR) 5 MG tablet TAKE 1 TABLET (5 MG TOTAL) BY MOUTH DAILY.   traZODone (DESYREL) 50 MG tablet Take 1 tablet (50 mg total) by mouth at bedtime.   No facility-administered encounter medications on file as of 12/01/2021.      Medical History: Past Medical History:  Diagnosis Date   Abscess of buttock, left    Hyperlipidemia    Hypertension    Tobacco use disorder 03/26/2021     Vital Signs: BP (!) 147/93   Pulse 88   Temp 98.3 F (36.8 C)   Resp 16   Ht 5\' 9"  (1.753 m)    Wt 270 lb (122.5 kg)   BMI 39.87 kg/m    Review of Systems  Constitutional:  Negative for chills, fatigue and unexpected weight change.  HENT:  Negative for congestion, postnasal drip, rhinorrhea, sneezing and sore throat.   Eyes:  Negative for redness.  Respiratory: Negative.  Negative for cough, chest tightness, shortness of breath and wheezing.   Cardiovascular:  Negative for chest pain and palpitations.  Gastrointestinal:  Negative for abdominal pain, constipation, diarrhea, nausea and vomiting.  Genitourinary:  Negative for dysuria and frequency.  Musculoskeletal:  Positive for arthralgias, joint swelling and neck pain (radiating from right shoulder). Negative for back pain.  Skin:  Negative for rash.  Neurological: Negative.  Negative for tremors and numbness.  Hematological:  Negative for adenopathy. Does not bruise/bleed easily.  Psychiatric/Behavioral:  Positive for sleep disturbance. Negative for behavioral problems (Depression) and suicidal ideas. The patient is not nervous/anxious.     Physical Exam Vitals reviewed.  Constitutional:      General: She is not in acute distress.    Appearance: Normal appearance. She is obese. She is not ill-appearing.  HENT:     Head: Normocephalic and atraumatic.  Eyes:     Pupils: Pupils are equal, round, and reactive to light.  Cardiovascular:     Rate and Rhythm: Normal rate and regular rhythm.     Heart sounds: Normal heart sounds.  No murmur heard. Musculoskeletal:     Right shoulder: Swelling, tenderness, bony tenderness and crepitus present. Decreased range of motion. Decreased strength.     Right wrist: Swelling present. Decreased range of motion.     Right hand: Swelling present. Decreased range of motion. Decreased strength.  Neurological:     Mental Status: She is alert and oriented to person, place, and time.  Psychiatric:        Mood and Affect: Mood normal.        Behavior: Behavior normal.        Assessment/Plan: 1. Acute pain of right shoulder Xray of shoulder and orthopedic referral ordered for further evaluation - DG Shoulder Right; Future - ibuprofen (ADVIL) 800 MG tablet; Take 1 tablet (800 mg total) by mouth every 8 (eight) hours. For 3-5 days then every 8 hours as needed for moderate to severe pain  Dispense: 90 tablet; Refill: 0 - Ambulatory referral to Orthopedic Surgery  2. Adhesive capsulitis of right shoulder Most likely diagnosis, xray ordered to help confirm, other possible differential Dx impingement syndrome, rotator cuff injury, frozen shoulder. 800 mg ibuprofen prescribed.  - DG Shoulder Right; Future - ibuprofen (ADVIL) 800 MG tablet; Take 1 tablet (800 mg total) by mouth every 8 (eight) hours. For 3-5 days then every 8 hours as needed for moderate to severe pain  Dispense: 90 tablet; Refill: 0 - Ambulatory referral to Orthopedic Surgery   General Counseling: Shalene verbalizes understanding of the findings of todays visit and agrees with plan of treatment. I have discussed any further diagnostic evaluation that may be needed or ordered today. We also reviewed her medications today. she has been encouraged to call the office with any questions or concerns that should arise related to todays visit.    Counseling:    Orders Placed This Encounter  Procedures   DG Shoulder Right   Ambulatory referral to Orthopedic Surgery    Meds ordered this encounter  Medications   ibuprofen (ADVIL) 800 MG tablet    Sig: Take 1 tablet (800 mg total) by mouth every 8 (eight) hours. For 3-5 days then every 8 hours as needed for moderate to severe pain    Dispense:  90 tablet    Refill:  0    Return if symptoms worsen or fail to improve.  [place] Controlled Substance Database was reviewed by me for overdose risk score (ORS)  Time spent:30 Minutes Time spent with patient included reviewing progress notes, labs, imaging studies, and discussing plan for follow up.    This patient was seen by Jonetta Osgood, FNP-C in collaboration with Dr. Clayborn Bigness as a part of collaborative care agreement.  Grier Czerwinski R. Valetta Fuller, MSN, FNP-C Internal Medicine

## 2021-12-01 NOTE — Telephone Encounter (Signed)
Awaiting 10/0/23 office notes for Orthopedic referral-Toni

## 2021-12-02 ENCOUNTER — Other Ambulatory Visit: Payer: Self-pay | Admitting: Physician Assistant

## 2021-12-02 DIAGNOSIS — G4709 Other insomnia: Secondary | ICD-10-CM

## 2021-12-26 ENCOUNTER — Encounter: Payer: Self-pay | Admitting: Nurse Practitioner

## 2021-12-28 ENCOUNTER — Telehealth: Payer: Self-pay | Admitting: Nurse Practitioner

## 2021-12-28 NOTE — Telephone Encounter (Signed)
Orthopedic referral sent via [employer] to Dr. Mack Guise with [facility]

## 2021-12-29 ENCOUNTER — Telehealth: Payer: Self-pay | Admitting: Physician Assistant

## 2021-12-29 NOTE — Telephone Encounter (Signed)
Orthopedic appointment 12/30/21 with [facility]

## 2021-12-30 DIAGNOSIS — M79644 Pain in right finger(s): Secondary | ICD-10-CM | POA: Diagnosis not present

## 2021-12-30 DIAGNOSIS — M25511 Pain in right shoulder: Secondary | ICD-10-CM | POA: Diagnosis not present

## 2022-01-12 DIAGNOSIS — M79644 Pain in right finger(s): Secondary | ICD-10-CM | POA: Diagnosis not present

## 2022-01-12 DIAGNOSIS — M71341 Other bursal cyst, right hand: Secondary | ICD-10-CM | POA: Diagnosis not present

## 2022-01-21 ENCOUNTER — Ambulatory Visit (INDEPENDENT_AMBULATORY_CARE_PROVIDER_SITE_OTHER): Payer: 59 | Admitting: Physician Assistant

## 2022-01-21 ENCOUNTER — Encounter: Payer: Self-pay | Admitting: Physician Assistant

## 2022-01-21 VITALS — BP 120/80 | HR 94 | Temp 97.8°F | Resp 16 | Ht 69.0 in | Wt 272.0 lb

## 2022-01-21 DIAGNOSIS — B372 Candidiasis of skin and nail: Secondary | ICD-10-CM | POA: Diagnosis not present

## 2022-01-21 MED ORDER — NYSTATIN-TRIAMCINOLONE 100000-0.1 UNIT/GM-% EX OINT: 1.0000 | TOPICAL_OINTMENT | Freq: Two times a day (BID) | CUTANEOUS | 0 refills | Status: DC

## 2022-01-21 NOTE — Progress Notes (Signed)
[facility] [place], [place] [address]  Internal MEDICINE  Office Visit Note  Patient Name: Kelsey Sampson  [id number]  [id number]  Date of Service: 01/21/2022  Chief Complaint  Patient presents with   Rash    Under both  breast and neck     HPI Pt is here for a sick visit. -Rash under both breasts along back of neck -It is very itching and rubbing is painful now -Started about 3 days ago. No change to soaps or detergents. -Tried zinc but hasn't helped -She does report she recently got a steroid injection for her shoulder and it is helping  Current Medication:  Outpatient Encounter Medications as of 01/21/2022  Medication Sig   Ascorbic Acid (VITAMIN C PO) Take 1 tablet by mouth daily.   hydrochlorothiazide (HYDRODIURIL) 25 MG tablet Take 1 tablet (25 mg total) by mouth daily.   ibuprofen (ADVIL) 200 MG tablet Take 600 mg by mouth every 6 (six) hours as needed for moderate pain or headache.   ibuprofen (ADVIL) 800 MG tablet Take 1 tablet (800 mg total) by mouth every 8 (eight) hours. For 3-5 days then every 8 hours as needed for moderate to severe pain   Multiple Vitamin (MULTIVITAMIN WITH MINERALS) TABS tablet Take 1 tablet by mouth daily.   Na Sulfate-K Sulfate-Mg Sulf 17.5-3.13-1.6 GM/177ML SOLN Take by mouth as directed.   nystatin-triamcinolone ointment (MYCOLOG) Apply 1 Application topically 2 (two) times daily.   rosuvastatin (CRESTOR) 5 MG tablet TAKE 1 TABLET (5 MG TOTAL) BY MOUTH DAILY.   traZODone (DESYREL) 50 MG tablet TAKE 1 TABLET BY MOUTH EVERYDAY AT BEDTIME   No facility-administered encounter medications on file as of 01/21/2022.      Medical History: Past Medical History:  Diagnosis Date   Abscess of buttock, left    Hyperlipidemia    Hypertension    Tobacco use disorder 03/26/2021     Vital Signs: BP 120/80   Pulse 94   Temp 97.8 F (36.6 C)   Resp 16   Ht 5\' 9"  (1.753 m)   Wt 272 lb (123.4 kg)   SpO2 96%    BMI 40.17 kg/m    Review of Systems  Constitutional:  Negative for fatigue and fever.  HENT:  Negative for congestion, mouth sores and postnasal drip.   Respiratory:  Negative for cough.   Cardiovascular:  Negative for chest pain.  Genitourinary:  Negative for flank pain.  Skin:  Positive for rash.  Psychiatric/Behavioral: Negative.      Physical Exam Vitals and nursing note reviewed.  Constitutional:      Appearance: Normal appearance. She is obese.  HENT:     Head: Normocephalic and atraumatic.  Cardiovascular:     Rate and Rhythm: Normal rate and regular rhythm.  Pulmonary:     Effort: Pulmonary effort is normal.     Breath sounds: Normal breath sounds.  Skin:    General: Skin is warm and dry.     Findings: Rash present.     Comments: Rash under both breasts and along back right side of neck  Neurological:     Mental Status: She is alert.  Psychiatric:        Mood and Affect: Mood normal.        Behavior: Behavior normal.       Assessment/Plan: 1. Candidiasis of skin Will start on mycolog for rash and advised to keep these areas clean and dry - nystatin-triamcinolone ointment (MYCOLOG); Apply  1 Application topically 2 (two) times daily.  Dispense: 30 g; Refill: 0   General Counseling: Kelsey Sampson verbalizes understanding of the findings of todays visit and agrees with plan of treatment. I have discussed any further diagnostic evaluation that may be needed or ordered today. We also reviewed her medications today. she has been encouraged to call the office with any questions or concerns that should arise related to todays visit.    Counseling:    No orders of the defined types were placed in this encounter.   Meds ordered this encounter  Medications   nystatin-triamcinolone ointment (MYCOLOG)    Sig: Apply 1 Application topically 2 (two) times daily.    Dispense:  30 g    Refill:  0    Time spent:30 Minutes

## 2022-01-27 DIAGNOSIS — M25511 Pain in right shoulder: Secondary | ICD-10-CM | POA: Diagnosis not present

## 2022-01-27 DIAGNOSIS — M7541 Impingement syndrome of right shoulder: Secondary | ICD-10-CM | POA: Diagnosis not present

## 2022-02-01 ENCOUNTER — Ambulatory Visit: Payer: 59 | Admitting: Physician Assistant

## 2022-02-02 DIAGNOSIS — M25511 Pain in right shoulder: Secondary | ICD-10-CM | POA: Diagnosis not present

## 2022-02-02 DIAGNOSIS — M7541 Impingement syndrome of right shoulder: Secondary | ICD-10-CM | POA: Diagnosis not present

## 2022-02-09 DIAGNOSIS — M79644 Pain in right finger(s): Secondary | ICD-10-CM | POA: Diagnosis not present

## 2022-02-09 DIAGNOSIS — M25511 Pain in right shoulder: Secondary | ICD-10-CM | POA: Diagnosis not present

## 2022-02-09 DIAGNOSIS — M7541 Impingement syndrome of right shoulder: Secondary | ICD-10-CM | POA: Diagnosis not present

## 2022-02-10 NOTE — Progress Notes (Deleted)
Patient ID: Kelsey Sampson, female   DOB: Jun 27, 1970, 51 y.o.   MRN: [id number]  Chief Complaint: Follow-up ventral hernia/morbid obesity  History of Present Illness No remarkable, measurable weight loss since her last visit.  She denies any increase in her pain, though the hernia bulge may be slightly larger.  She may need to engage in further exercise, however she believes she is adequately watching and limiting her diet and avoiding unhelpful calories.  She is still highly motivated to pursue weight loss.  No other complaints today. Previously: In the 6-month follow-up visit today, she is lost over 10 pounds.  She is delighted, she reported she was not checking her weight because she did not want to be disappointed.  She has quit smoking.  She reports her bowel habits are easy and regular. Previously Kelsey Sampson is a 51 y.o. female with a prior history of umbilical hernia repair as a child.  Redeveloped acute abdominal pain and was seen, evaluated and CT scan confirmed a fat filled supraumbilical abdominal wall hernia, whose "base" measures 1.8 cm.  No bowel involvement.  She reports a bulge that is tender on palpation.  She denies any vomiting, fevers and chills.  She reports some nausea, with normal bowel movements. She is a smoker and pain is exacerbated with her cough.  Desires to quit smoking, and feels it would not be an issue preop.  We also discussed other optimization including weight loss and she is motivated, but I do not believe we should defer her surgery until this happens.  Past Medical History Past Medical History:  Diagnosis Date   Abscess of buttock, left    Hyperlipidemia    Hypertension    Tobacco use disorder 03/26/2021      Past Surgical History:  Procedure Laterality Date   ABDOMINAL HYSTERECTOMY     COLONOSCOPY WITH PROPOFOL N/A 10/15/2021   Procedure: COLONOSCOPY WITH PROPOFOL;  Surgeon: Wyline Mood, MD;  Location: [facility] ENDOSCOPY;  Service: Gastroenterology;   Laterality: N/A;   cyst removed Left    51 years old cyst removed    No Known Allergies  Current Outpatient Medications  Medication Sig Dispense Refill   Ascorbic Acid (VITAMIN C PO) Take 1 tablet by mouth daily.     hydrochlorothiazide (HYDRODIURIL) 25 MG tablet Take 1 tablet (25 mg total) by mouth daily. 90 tablet 3   ibuprofen (ADVIL) 200 MG tablet Take 600 mg by mouth every 6 (six) hours as needed for moderate pain or headache.     ibuprofen (ADVIL) 800 MG tablet Take 1 tablet (800 mg total) by mouth every 8 (eight) hours. For 3-5 days then every 8 hours as needed for moderate to severe pain 90 tablet 0   Multiple Vitamin (MULTIVITAMIN WITH MINERALS) TABS tablet Take 1 tablet by mouth daily.     Na Sulfate-K Sulfate-Mg Sulf 17.5-3.13-1.6 GM/177ML SOLN Take by mouth as directed.     nystatin-triamcinolone ointment (MYCOLOG) Apply 1 Application topically 2 (two) times daily. 30 g 0   rosuvastatin (CRESTOR) 5 MG tablet TAKE 1 TABLET (5 MG TOTAL) BY MOUTH DAILY. 90 tablet 1   traZODone (DESYREL) 50 MG tablet TAKE 1 TABLET BY MOUTH EVERYDAY AT BEDTIME 90 tablet 1   No current facility-administered medications for this visit.    Family History Family History  Problem Relation Age of Onset   Hypertension Mother       Social History Social History   Tobacco Use   Smoking status: Former  Types: Cigarettes    Quit date: 11/30/2020    Years since quitting: 1.1   Smokeless tobacco: Never  Vaping Use   Vaping Use: Some days   Substances: Flavoring  Substance Use Topics   Alcohol use: No   Drug use: Not Currently    Types: Marijuana    Comment: over 30 days ago        Review of Systems  Constitutional: Negative.   HENT: Negative.    Eyes: Negative.   Respiratory: Negative.    Cardiovascular: Negative.   Gastrointestinal:  Negative for abdominal pain and nausea.  Skin: Negative.   Neurological: Negative.   Psychiatric/Behavioral: Negative.        Physical  Exam There were no vitals taken for this visit.    CONSTITUTIONAL: Well developed, and nourished, appropriately responsive and aware without distress.   EYES: Sclera non-icteric.   EARS, NOSE, MOUTH AND THROAT: Mask worn.   The oropharynx is clear. Oral mucosa is pink and moist.   Hearing is intact to voice.  NECK: Trachea is midline, and there is no jugular venous distension.  LYMPH NODES:  Lymph nodes in the neck are not enlarged. RESPIRATORY:  Lungs are clear, and breath sounds are equal bilaterally. Normal respiratory effort without pathologic use of accessory muscles. CARDIOVASCULAR: Heart is regular in rate and rhythm. GI: The abdomen is notable for a supraumbilical mass that is non-tender to touch, partially-reducible limited by tenderness.  Her abdomen is otherwise soft, nontender, and nondistended. There were no other palpable masses. I did not appreciate hepatosplenomegaly. There were normal bowel sounds. MUSCULOSKELETAL:  Symmetrical muscle tone appreciated in all four extremities.    SKIN: Skin turgor is normal. No pathologic skin lesions appreciated.  NEUROLOGIC:  Motor and sensation appear grossly normal.  Cranial nerves are grossly without defect. PSYCH:  Alert and oriented to person, place and time. Affect is appropriate for situation.  Data Reviewed I have personally reviewed what is currently available of the patient's imaging, recent labs and medical records.   Labs:     Latest Ref Rng & Units 09/18/2021    8:44 AM 04/21/2021   10:49 AM 03/21/2021    7:33 AM  CBC  WBC 3.4 - 10.8 x10E3/uL 8.5  7.6  9.8   Hemoglobin 11.1 - 15.9 g/dL 87.8  67.6  72.0   Hematocrit 34.0 - 46.6 % 44.1  44.9  46.8   Platelets 150 - 450 x10E3/uL 214  241  250       Latest Ref Rng & Units 09/18/2021    8:44 AM 04/21/2021   10:49 AM 03/21/2021    7:33 AM  CMP  Glucose 70 - 99 mg/dL 947  096  283   BUN 6 - 24 mg/dL 10  12  11    Creatinine 0.57 - 1.00 mg/dL  6.62  9.47   Sodium 134 -  144 mmol/L 142  136  140   Potassium 3.5 - 5.2 mmol/L 4.6  3.7  4.1   Chloride 96 - 106 mmol/L 105  103  107   CO2 20 - 29 mmol/L 22  27  25    Calcium 8.7 - 10.2 mg/dL 9.6  9.2  9.3   Total Protein 6.0 - 8.5 g/dL 7.3  7.8  7.6   Total Bilirubin 0.0 - 1.2 mg/dL 0.4  0.6  0.5   Alkaline Phos 44 - 121 IU/L 141  119  113   AST 0 - 40 IU/L 14  22  18   ALT 0 - 32 IU/L 14  27  19        Imaging: Radiology review:  ADDENDUM REPORT: 03/21/2021 10:38   ADDENDUM: The midline hernia is actually supraumbilical, lying 3 cm above the umbilicus. This was not present on the prior CT.     Electronically Signed   By: 03/23/2021 M.D.   On: 03/21/2021 10:38    Addended by 03/23/2021, MD on 03/21/2021 10:40 AM   Study Result  Narrative & Impression  CLINICAL DATA:  Triage note: Pt via POV from home. Pt c/o lower abd pain that radiates to her back that started yesterday, pt noted to have a umbilical hernia that she has not been dx with. States that it hurts her when she coughs. Denies NVD. Pt A&OX4 and NAD.   EXAM: CT ABDOMEN AND PELVIS WITH CONTRAST   TECHNIQUE: Multidetector CT imaging of the abdomen and pelvis was performed using the standard protocol following bolus administration of intravenous contrast.   RADIATION DOSE REDUCTION: This exam was performed according to the departmental dose-optimization program which includes automated exposure control, adjustment of the mA and/or kV according to patient size and/or use of iterative reconstruction technique.   CONTRAST:  03/23/2021 OMNIPAQUE IOHEXOL 300 MG/ML  SOLN   COMPARISON:  04/28/2013   FINDINGS: Lower chest: No acute abnormality.   Hepatobiliary: Liver normal in size and overall attenuation. There several low-density liver lesions, largest in the posterior aspect of segment 2, 1.8 cm, all consistent with cysts. No other liver lesions. Gallbladder is distended. There is hypoattenuating material, higher in attenuation  than fluid, within the gallbladder, consistent with sludge. No radiopaque stones. No wall thickening or inflammation. No bile duct dilation.   Pancreas: Unremarkable. No pancreatic ductal dilatation or surrounding inflammatory changes.   Spleen: Normal in size without focal abnormality.   Adrenals/Urinary Tract: Adrenal glands are unremarkable. Kidneys are normal, without renal calculi, focal lesion, or hydronephrosis. Bladder is unremarkable.   Stomach/Bowel: Normal stomach. Small bowel and colon are normal in caliber. No wall thickening or inflammation. There is fat attenuation along the wall of the colon, nonspecific, finding that can be seen as result of remote inflammation. There are scattered left colon diverticula without diverticulitis. Normal appendix visualized.   Vascular/Lymphatic: Mild aortic atherosclerosis. No aneurysm. No enlarged lymph nodes.   Reproductive: Uterus and bilateral adnexa are unremarkable.   Other: Fat containing umbilical hernia base measuring 1.8 cm. No bowel enters this. Herniated fat is surrounded by a rim of increased attenuation and mild haziness in the adjacent subcutaneous fat suggesting inflammation.   No ascites.   Musculoskeletal: No fracture or acute finding.  No bone lesion.   IMPRESSION: 1. Small fat containing umbilical hernia associated with mild inflammatory changes. No bowel enters the hernia. 2. No other evidence of an acute abnormality. 3. Left colon diverticula without evidence of diverticulitis. 4. Aortic atherosclerosis.   Electronically Signed: By: 06/28/2013 M.D. On: 03/21/2021 10:14   Within last 24 hrs: No results found.  Assessment     Patient Active Problem List   Diagnosis Date Noted   Morbid obesity (HCC) 03/26/2021   Incarcerated ventral hernia 03/26/2021    Plan    As she continues to be highly motivated to optimize her potential for a primary repair without utilization of mesh, we will  continue to expect this trend of weight loss to continue.  She desires to continue with this game plan we will see her back in  3 months.  Face-to-face time spent with the patient and accompanying care providers(if present) was 20 minutes, with more than 50% of the time spent counseling, educating, and coordinating care of the patient.    These notes generated with voice recognition software. I apologize for typographical errors.  Campbell Lernerenny Selena Swaminathan M.D., FACS 02/10/2022, 7:10 PM

## 2022-02-11 ENCOUNTER — Ambulatory Visit: Payer: 59 | Admitting: Surgery

## 2022-02-25 ENCOUNTER — Ambulatory Visit (INDEPENDENT_AMBULATORY_CARE_PROVIDER_SITE_OTHER): Payer: 59 | Admitting: Physician Assistant

## 2022-02-25 ENCOUNTER — Encounter: Payer: Self-pay | Admitting: Physician Assistant

## 2022-02-25 VITALS — BP 113/71 | HR 79 | Temp 98.5°F | Resp 16 | Ht 69.0 in | Wt 275.0 lb

## 2022-02-25 DIAGNOSIS — R319 Hematuria, unspecified: Secondary | ICD-10-CM

## 2022-02-25 DIAGNOSIS — R252 Cramp and spasm: Secondary | ICD-10-CM

## 2022-02-25 DIAGNOSIS — M25511 Pain in right shoulder: Secondary | ICD-10-CM | POA: Diagnosis not present

## 2022-02-25 DIAGNOSIS — M7541 Impingement syndrome of right shoulder: Secondary | ICD-10-CM | POA: Diagnosis not present

## 2022-02-25 DIAGNOSIS — E782 Mixed hyperlipidemia: Secondary | ICD-10-CM | POA: Diagnosis not present

## 2022-02-25 DIAGNOSIS — R300 Dysuria: Secondary | ICD-10-CM | POA: Diagnosis not present

## 2022-02-25 DIAGNOSIS — N390 Urinary tract infection, site not specified: Secondary | ICD-10-CM

## 2022-02-25 DIAGNOSIS — E1159 Type 2 diabetes mellitus with other circulatory complications: Secondary | ICD-10-CM

## 2022-02-25 LAB — POCT GLYCOSYLATED HEMOGLOBIN (HGB A1C): Hemoglobin A1C: 6.7 % — AB (ref 4.0–5.6)

## 2022-02-25 LAB — POCT URINALYSIS DIPSTICK
Bilirubin, UA: NEGATIVE
Glucose, UA: NEGATIVE
Ketones, UA: NEGATIVE
Leukocytes, UA: NEGATIVE
Nitrite, UA: NEGATIVE
Protein, UA: NEGATIVE
Spec Grav, UA: 1.015 (ref 1.010–1.025)
Urobilinogen, UA: 0.2 E.U./dL
pH, UA: 6.5 (ref 5.0–8.0)

## 2022-02-25 MED ORDER — ROSUVASTATIN CALCIUM 5 MG PO TABS: 5.0000 mg | ORAL_TABLET | Freq: Every day | ORAL | 1 refills | Status: DC

## 2022-02-25 MED ORDER — RYBELSUS 3 MG PO TABS: 3.0000 mg | ORAL_TABLET | Freq: Every day | ORAL | 1 refills | Status: DC

## 2022-02-25 NOTE — Progress Notes (Signed)
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 Muscle relaxer helped therefore likely MSK related -Last 2 days some back pain as well. Hasn't helped her sleep. Did have PT this morning for her shoulder and took ibuprofen. No pain currently in office today. -Will check urine in case pain possibly due to kidney infection, however intermittent and no urinary symptoms currently.  -Will check labs due to cramping and see if cholesterol improved. Discussed statin can cause some msucle aches as well. -Now in diabetic range, will start on rybelsus to help lower BG and and aid wt loss  Current Medication: Outpatient Encounter Medications as of 02/25/2022  Medication Sig   Ascorbic Acid (VITAMIN C PO) Take 1 tablet by mouth daily.   hydrochlorothiazide (HYDRODIURIL) 25 MG tablet Take 1 tablet (25 mg total) by mouth daily.   ibuprofen (ADVIL) 200 MG tablet Take 600 mg by mouth every 6 (six) hours as needed for moderate pain or headache.   ibuprofen (ADVIL) 800 MG tablet Take 1 tablet (800 mg total) by mouth every 8 (eight) hours. For 3-5 days then every 8 hours as needed for moderate to severe pain   Multiple Vitamin (MULTIVITAMIN WITH MINERALS) TABS tablet Take 1 tablet by mouth daily.   Na Sulfate-K Sulfate-Mg Sulf 17.5-3.13-1.6 GM/177ML SOLN Take by mouth as directed.   nystatin-triamcinolone ointment (MYCOLOG) Apply 1 Application topically 2 (two) times daily.   Semaglutide (RYBELSUS) 3 MG TABS Take 3 mg by mouth daily.   traZODone (DESYREL) 50 MG tablet TAKE 1 TABLET BY MOUTH EVERYDAY AT BEDTIME    [DISCONTINUED] rosuvastatin (CRESTOR) 5 MG tablet TAKE 1 TABLET (5 MG TOTAL) BY MOUTH DAILY.   rosuvastatin (CRESTOR) 5 MG tablet Take 1 tablet (5 mg total) by mouth daily.   No facility-administered encounter medications on file as of 02/25/2022.    Surgical History: Past Surgical History:  Procedure Laterality Date   ABDOMINAL HYSTERECTOMY     COLONOSCOPY WITH PROPOFOL N/A 10/15/2021   Procedure: COLONOSCOPY WITH PROPOFOL;  Surgeon: Wyline Mood, MD;  Location: [facility] ENDOSCOPY;  Service: Gastroenterology;  Laterality: N/A;   cyst removed Left    52 years old cyst removed    Medical History: Past Medical History:  Diagnosis Date   Abscess of buttock, left    Hyperlipidemia    Hypertension    Tobacco use disorder 03/26/2021    Family History: Family History  Problem Relation Age of Onset   Hypertension Mother     Social History   Socioeconomic History   Marital status: Single    Spouse name: Not on file   Number of children: Not on file   Years of education: Not on file   Highest education level: Not on file  Occupational History   Not on file  Tobacco Use   Smoking status: Every Day    Types: Cigarettes    Last attempt to quit: 11/30/2020    Years since quitting: 1.2   Smokeless tobacco: Never   Tobacco comments:    1 pack every 2 days.  Vaping Use  Vaping Use: Some days   Substances: Flavoring  Substance and Sexual Activity   Alcohol use: No   Drug use: Not Currently    Types: Marijuana    Comment: over 30 days ago   Sexual activity: Not Currently    Partners: Male  Other Topics Concern   Not on file  Social History Narrative   Not on file   Social Determinants of Health   Financial Resource Strain: Not on file  Food Insecurity: Not on file  Transportation Needs: Not on file  Physical Activity: Not on file  Stress: Not on file  Social Connections: Not on file  Intimate Partner Violence: Not At Risk (04/02/2021)   Humiliation, Afraid, Rape, and Kick  questionnaire    Fear of Current or Ex-Partner: No    Emotionally Abused: No    Physically Abused: No    Sexually Abused: No      Review of Systems  Constitutional:  Negative for chills, fatigue and unexpected weight change.  HENT:  Negative for congestion, postnasal drip, rhinorrhea, sneezing and sore throat.   Eyes:  Negative for redness.  Respiratory:  Negative for cough, chest tightness and shortness of breath.   Cardiovascular:  Negative for chest pain and palpitations.  Gastrointestinal:  Negative for abdominal pain, constipation, diarrhea, nausea and vomiting.  Genitourinary:  Negative for dysuria and frequency.  Musculoskeletal:  Positive for arthralgias, back pain and myalgias. Negative for joint swelling and neck pain.  Skin:  Negative for rash.  Neurological: Negative.  Negative for tremors and numbness.  Hematological:  Negative for adenopathy. Does not bruise/bleed easily.  Psychiatric/Behavioral:  Negative for behavioral problems (Depression), sleep disturbance and suicidal ideas. The patient is not nervous/anxious.     Vital Signs: BP 113/71   Pulse 79   Temp 98.5 F (36.9 C)   Resp 16   Ht 5\' 9"  (1.753 m)   Wt 275 lb (124.7 kg)   SpO2 96%   BMI 40.61 kg/m    Physical Exam Vitals and nursing note reviewed.  Constitutional:      General: She is not in acute distress.    Appearance: Normal appearance. She is well-developed. She is obese. She is not diaphoretic.  HENT:     Head: Normocephalic and atraumatic.     Mouth/Throat:     Pharynx: No oropharyngeal exudate.  Eyes:     Pupils: Pupils are equal, round, and reactive to light.  Neck:     Thyroid: No thyromegaly.     Vascular: No JVD.     Trachea: No tracheal deviation.  Cardiovascular:     Rate and Rhythm: Normal rate and regular rhythm.     Heart sounds: Normal heart sounds. No murmur heard.    No friction rub. No gallop.  Pulmonary:     Effort: Pulmonary effort is normal. No respiratory  distress.     Breath sounds: No wheezing or rales.  Chest:     Chest wall: No tenderness.  Abdominal:     General: Bowel sounds are normal.     Palpations: Abdomen is soft.     Tenderness: There is no abdominal tenderness. There is no right CVA tenderness or left CVA tenderness.  Musculoskeletal:        General: Normal range of motion.     Cervical back: Normal range of motion and neck supple.  Lymphadenopathy:     Cervical: No cervical adenopathy.  Skin:    General: Skin is warm and dry.  Neurological:  Mental Status: She is alert and oriented to person, place, and time.     Cranial Nerves: No cranial nerve deficit.  Psychiatric:        Behavior: Behavior normal.        Thought Content: Thought content normal.        Judgment: Judgment normal.        Assessment/Plan: 1. Type 2 diabetes mellitus with other circulatory complication, without long-term current use of insulin (HCC) - POCT HgB A1C is 6.7 which is increased from 6.2 last visit and is now in diabetic range. Will work on diet and exercise and also start rybelsus to aid lowering BG and help wt loss goals - Semaglutide (RYBELSUS) 3 MG TABS; Take 3 mg by mouth daily.  Dispense: 30 tablet; Refill: 1  2. Muscle cramps - Comprehensive metabolic panel - Magnesium  3. Mixed hyperlipidemia - rosuvastatin (CRESTOR) 5 MG tablet; Take 1 tablet (5 mg total) by mouth daily.  Dispense: 90 tablet; Refill: 1 - Lipid Panel With LDL/HDL Ratio  4. Urinary tract infection with hematuria, site unspecified - CULTURE, URINE COMPREHENSIVE  5. Dysuria - POCT Urinalysis Dipstick   General Counseling: Kawanda verbalizes understanding of the findings of todays visit and agrees with plan of treatment. I have discussed any further diagnostic evaluation that may be needed or ordered today. We also reviewed her medications today. she has been encouraged to call the office with any questions or concerns that should arise related to todays  visit.    Orders Placed This Encounter  Procedures   CULTURE, URINE COMPREHENSIVE   Comprehensive metabolic panel   Magnesium   Lipid Panel With LDL/HDL Ratio   POCT HgB A1C   POCT Urinalysis Dipstick    Meds ordered this encounter  Medications   rosuvastatin (CRESTOR) 5 MG tablet    Sig: Take 1 tablet (5 mg total) by mouth daily.    Dispense:  90 tablet    Refill:  1   Semaglutide (RYBELSUS) 3 MG TABS    Sig: Take 3 mg by mouth daily.    Dispense:  30 tablet    Refill:  1    This patient was seen by Drema Dallas, PA-C in collaboration with Dr. Clayborn Bigness as a part of collaborative care agreement.   Total time spent:30 Minutes Time spent includes review of chart, medications, test results, and follow up plan with the patient.      Dr Lavera Guise Internal medicine

## 2022-03-01 DIAGNOSIS — M25511 Pain in right shoulder: Secondary | ICD-10-CM | POA: Diagnosis not present

## 2022-03-01 DIAGNOSIS — M7541 Impingement syndrome of right shoulder: Secondary | ICD-10-CM | POA: Diagnosis not present

## 2022-03-01 LAB — CULTURE, URINE COMPREHENSIVE

## 2022-03-08 DIAGNOSIS — M7541 Impingement syndrome of right shoulder: Secondary | ICD-10-CM | POA: Diagnosis not present

## 2022-03-08 DIAGNOSIS — M25511 Pain in right shoulder: Secondary | ICD-10-CM | POA: Diagnosis not present

## 2022-03-18 DIAGNOSIS — M25511 Pain in right shoulder: Secondary | ICD-10-CM | POA: Diagnosis not present

## 2022-03-18 DIAGNOSIS — M7541 Impingement syndrome of right shoulder: Secondary | ICD-10-CM | POA: Diagnosis not present

## 2022-03-25 ENCOUNTER — Ambulatory Visit (INDEPENDENT_AMBULATORY_CARE_PROVIDER_SITE_OTHER): Payer: Medicaid Other | Admitting: Physician Assistant

## 2022-03-25 ENCOUNTER — Encounter: Payer: Self-pay | Admitting: Physician Assistant

## 2022-03-25 VITALS — BP 114/83 | HR 78 | Temp 98.3°F | Resp 16 | Ht 69.0 in | Wt 269.2 lb

## 2022-03-25 DIAGNOSIS — E1159 Type 2 diabetes mellitus with other circulatory complications: Secondary | ICD-10-CM

## 2022-03-25 DIAGNOSIS — I10 Essential (primary) hypertension: Secondary | ICD-10-CM | POA: Diagnosis not present

## 2022-03-25 MED ORDER — RYBELSUS 3 MG PO TABS: 3.0000 mg | ORAL_TABLET | Freq: Every day | ORAL | 1 refills | Status: DC

## 2022-03-25 NOTE — Progress Notes (Signed)
[facility] [place],  [address]  Internal MEDICINE  Office Visit Note  Patient Name: Kelsey Sampson  [id number]  [id number]  Date of Service: 03/25/2022  Chief Complaint  Patient presents with   Follow-up   Hyperlipidemia   Hypertension    HPI Pt is here for routine follow up -Never got started on rybelsus, and will resend as we never received any PA or info about this being denied. If not covered she is open to an injection medication instead and will watch tutorial on how to give injection if so. -Down 6lbs since last visit on her own and will continue to work on this -Unfortunately her cousin was [place] dead [place] recently and has not been given any information and is causing stress and impacting her sleep. She is going to follow up on this to see if she can get more info -Has not gotten blood work done yet and will do this -No more muscle cramping anymore -Done with PT for shoulder now and is feeling better  Current Medication: Outpatient Encounter Medications as of 03/25/2022  Medication Sig   Ascorbic Acid (VITAMIN C PO) Take 1 tablet by mouth daily.   hydrochlorothiazide (HYDRODIURIL) 25 MG tablet Take 1 tablet (25 mg total) by mouth daily.   ibuprofen (ADVIL) 200 MG tablet Take 600 mg by mouth every 6 (six) hours as needed for moderate pain or headache.   ibuprofen (ADVIL) 800 MG tablet Take 1 tablet (800 mg total) by mouth every 8 (eight) hours. For 3-5 days then every 8 hours as needed for moderate to severe pain   Multiple Vitamin (MULTIVITAMIN WITH MINERALS) TABS tablet Take 1 tablet by mouth daily.   Na Sulfate-K Sulfate-Mg Sulf 17.5-3.13-1.6 GM/177ML SOLN Take by mouth as directed.   nystatin-triamcinolone ointment (MYCOLOG) Apply 1 Application topically 2 (two) times daily.   rosuvastatin (CRESTOR) 5 MG tablet Take 1 tablet (5 mg total) by mouth daily.   traZODone (DESYREL) 50 MG tablet TAKE 1 TABLET BY MOUTH EVERYDAY AT BEDTIME    [DISCONTINUED] Semaglutide (RYBELSUS) 3 MG TABS Take 3 mg by mouth daily.   Semaglutide (RYBELSUS) 3 MG TABS Take 1 tablet (3 mg total) by mouth daily.   No facility-administered encounter medications on file as of 03/25/2022.    Surgical History: Past Surgical History:  Procedure Laterality Date   ABDOMINAL HYSTERECTOMY     COLONOSCOPY WITH PROPOFOL N/A 10/15/2021   Procedure: COLONOSCOPY WITH PROPOFOL;  Surgeon: Jonathon Bellows, MD;  Location: [facility] ENDOSCOPY;  Service: Gastroenterology;  Laterality: N/A;   cyst removed Left    52 years old cyst removed    Medical History: Past Medical History:  Diagnosis Date   Abscess of buttock, left    Hyperlipidemia    Hypertension    Tobacco use disorder 03/26/2021    Family History: Family History  Problem Relation Age of Onset   Hypertension Mother     Social History   Socioeconomic History   Marital status: Single    Spouse name: Not on file   Number of children: Not on file   Years of education: Not on file   Highest education level: Not on file  Occupational History   Not on file  Tobacco Use   Smoking status: Every Day    Types: Cigarettes    Last attempt to quit: 11/30/2020    Years since quitting: 1.3   Smokeless tobacco: Never   Tobacco comments:    1  pack every 2 days.  Vaping Use   Vaping Use: Some days   Substances: Flavoring  Substance and Sexual Activity   Alcohol use: No   Drug use: Not Currently    Types: Marijuana    Comment: over 30 days ago   Sexual activity: Not Currently    Partners: Male  Other Topics Concern   Not on file  Social History Narrative   Not on file   Social Determinants of Health   Financial Resource Strain: Not on file  Food Insecurity: Not on file  Transportation Needs: Not on file  Physical Activity: Not on file  Stress: Not on file  Social Connections: Not on file  Intimate Partner Violence: Not At Risk (04/02/2021)   Humiliation, Afraid, Rape, and Kick questionnaire     Fear of Current or Ex-Partner: No    Emotionally Abused: No    Physically Abused: No    Sexually Abused: No      Review of Systems  Constitutional:  Negative for chills, fatigue and unexpected weight change.  HENT:  Negative for congestion, postnasal drip, rhinorrhea, sneezing and sore throat.   Eyes:  Negative for redness.  Respiratory:  Negative for cough, chest tightness and shortness of breath.   Cardiovascular:  Negative for chest pain and palpitations.  Gastrointestinal:  Negative for abdominal pain, constipation, diarrhea, nausea and vomiting.  Genitourinary:  Negative for dysuria and frequency.  Musculoskeletal:  Positive for back pain. Negative for joint swelling and neck pain.  Skin:  Negative for rash.  Neurological: Negative.  Negative for tremors and numbness.  Hematological:  Negative for adenopathy. Does not bruise/bleed easily.  Psychiatric/Behavioral:  Positive for sleep disturbance. Negative for behavioral problems (Depression) and suicidal ideas. The patient is not nervous/anxious.     Vital Signs: BP 114/83   Pulse 78   Temp 98.3 F (36.8 C)   Resp 16   Ht 5\' 9"  (1.753 m)   Wt 269 lb 3.2 oz (122.1 kg)   SpO2 93%   BMI 39.75 kg/m    Physical Exam Vitals and nursing note reviewed.  Constitutional:      General: She is not in acute distress.    Appearance: Normal appearance. She is well-developed. She is obese. She is not diaphoretic.  HENT:     Head: Normocephalic and atraumatic.     Mouth/Throat:     Pharynx: No oropharyngeal exudate.  Eyes:     Pupils: Pupils are equal, round, and reactive to light.  Neck:     Thyroid: No thyromegaly.     Vascular: No JVD.     Trachea: No tracheal deviation.  Cardiovascular:     Rate and Rhythm: Normal rate and regular rhythm.     Heart sounds: Normal heart sounds. No murmur heard.    No friction rub. No gallop.  Pulmonary:     Effort: Pulmonary effort is normal. No respiratory distress.     Breath sounds:  No wheezing or rales.  Chest:     Chest wall: No tenderness.  Abdominal:     General: Bowel sounds are normal.     Palpations: Abdomen is soft.  Musculoskeletal:        General: Normal range of motion.     Cervical back: Normal range of motion and neck supple.  Lymphadenopathy:     Cervical: No cervical adenopathy.  Skin:    General: Skin is warm and dry.  Neurological:     Mental Status: She is alert and  oriented to person, place, and time.     Cranial Nerves: No cranial nerve deficit.  Psychiatric:        Behavior: Behavior normal.        Thought Content: Thought content normal.        Judgment: Judgment normal.        Assessment/Plan: 1. Type 2 diabetes mellitus with other circulatory complication, without long-term current use of insulin ([place]) Will resend rybelsus, if not covered may consider alternative injection medication such as ozempic or mounjaro - Microalbumin, urine - Semaglutide (RYBELSUS) 3 MG TABS; Take 1 tablet (3 mg total) by mouth daily.  Dispense: 30 tablet; Refill: 1  2. Essential hypertension Stable, continue current medication   General Counseling: Myya verbalizes understanding of the findings of todays visit and agrees with plan of treatment. I have discussed any further diagnostic evaluation that may be needed or ordered today. We also reviewed her medications today. she has been encouraged to call the office with any questions or concerns that should arise related to todays visit.    Orders Placed This Encounter  Procedures   Microalbumin, urine    Meds ordered this encounter  Medications   Semaglutide (RYBELSUS) 3 MG TABS    Sig: Take 1 tablet (3 mg total) by mouth daily.    Dispense:  30 tablet    Refill:  1    This patient was seen by Drema Dallas, PA-C in collaboration with Dr. Clayborn Bigness as a part of collaborative care agreement.   Total time spent:30 Minutes Time spent includes review of chart, medications, test results, and  follow up plan with the patient.      Dr Lavera Guise Internal medicine

## 2022-03-28 LAB — MICROALBUMIN, URINE: Microalbumin, Urine: <3 ug/mL

## 2022-04-09 ENCOUNTER — Other Ambulatory Visit: Payer: Self-pay | Admitting: Physician Assistant

## 2022-04-09 DIAGNOSIS — G4709 Other insomnia: Secondary | ICD-10-CM

## 2022-04-19 NOTE — Progress Notes (Unsigned)
Patient ID: Kelsey Sampson, female   DOB: 10/19/1970, 52 y.o.   MRN: [id number]  Chief Complaint: Follow-up ventral hernia/morbid obesity  History of Present Illness No remarkable, measurable weight loss since her last visit.  She denies any pain, though the hernia bulge has not diminished.  She believes she is watching her diet and avoiding unhelpful calories.  She is still motivated to pursue weight loss.  No other complaints today.   Previously: In the 69-monthfollow-up visit today, she is lost over 10 pounds.  She is delighted, she reported she was not checking her weight because she did not want to be disappointed.  She has quit smoking.  She reports her bowel habits are easy and regular. Previously Kelsey DICENZOis a 52y.o. female with a prior history of umbilical hernia repair as a child.  Redeveloped acute abdominal pain and was seen, evaluated and CT scan confirmed a fat filled supraumbilical abdominal wall hernia, whose "base" measures 1.8 cm.  No bowel involvement.  She reports a bulge that is tender on palpation.  She denies any vomiting, fevers and chills.  She reports some nausea, with normal bowel movements. She is a smoker and pain is exacerbated with her cough.  Desires to quit smoking, and feels it would not be an issue preop.  We also discussed other optimization including weight loss and she is motivated, but I do not believe we should defer her surgery until this happens.  Past Medical History Past Medical History:  Diagnosis Date   Abscess of buttock, left    Hyperlipidemia    Hypertension    Tobacco use disorder 03/26/2021      Past Surgical History:  Procedure Laterality Date   ABDOMINAL HYSTERECTOMY     COLONOSCOPY WITH PROPOFOL N/A 10/15/2021   Procedure: COLONOSCOPY WITH PROPOFOL;  Surgeon: AJonathon Bellows MD;  [facility];  Service: Gastroenterology;  Laterality: N/A;   cyst removed Left    52years old cyst removed    No Known Allergies  Current  Outpatient Medications  Medication Sig Dispense Refill   Ascorbic Acid (VITAMIN C PO) Take 1 tablet by mouth daily.     hydrochlorothiazide (HYDRODIURIL) 25 MG tablet Take 1 tablet (25 mg total) by mouth daily. 90 tablet 3   ibuprofen (ADVIL) 200 MG tablet Take 600 mg by mouth every 6 (six) hours as needed for moderate pain or headache.     ibuprofen (ADVIL) 800 MG tablet Take 1 tablet (800 mg total) by mouth every 8 (eight) hours. For 3-5 days then every 8 hours as needed for moderate to severe pain 90 tablet 0   Multiple Vitamin (MULTIVITAMIN WITH MINERALS) TABS tablet Take 1 tablet by mouth daily.     Na Sulfate-K Sulfate-Mg Sulf 17.5-3.13-1.6 GM/177ML SOLN Take by mouth as directed.     nystatin-triamcinolone ointment (MYCOLOG) Apply 1 Application topically 2 (two) times daily. 30 g 0   rosuvastatin (CRESTOR) 5 MG tablet Take 1 tablet (5 mg total) by mouth daily. 90 tablet 1   Semaglutide (RYBELSUS) 3 MG TABS Take 1 tablet (3 mg total) by mouth daily. 30 tablet 1   traZODone (DESYREL) 50 MG tablet TAKE 1 TABLET BY MOUTH EVERYDAY AT BEDTIME 90 tablet 1   No current facility-administered medications for this visit.    Family History Family History  Problem Relation Age of Onset   Hypertension Mother       Social History Social History   Tobacco Use  Smoking status: Every Day    Types: Cigarettes    Last attempt to quit: 11/30/2020    Years since quitting: 1.3   Smokeless tobacco: Never   Tobacco comments:    1 pack every 2 days.  Vaping Use   Vaping Use: Some days   Substances: Flavoring  Substance Use Topics   Alcohol use: No   Drug use: Not Currently    Types: Marijuana    Comment: over 30 days ago        Review of Systems  Constitutional: Negative.   HENT: Negative.    Eyes: Negative.   Respiratory: Negative.    Cardiovascular: Negative.   Gastrointestinal:  Negative for abdominal pain and nausea.  Skin: Negative.   Neurological: Negative.    Psychiatric/Behavioral: Negative.        Physical Exam Blood pressure 113/75, pulse 87, temperature 98.7 F (37.1 C), temperature source Oral, height '5\' 9"'$  (1.753 m), weight 268 lb 6.4 oz (121.7 kg), SpO2 95 %. Last Weight  Most recent update: 04/20/2022  1:48 PM    Weight  121.7 kg (268 lb 6.4 oz)             CONSTITUTIONAL: Well developed, and nourished, appropriately responsive and aware without distress.   EYES: Sclera non-icteric.   EARS, NOSE, MOUTH AND THROAT: Mask worn.   The oropharynx is clear. Oral mucosa is pink and moist.   Hearing is intact to voice.  NECK: Trachea is midline, and there is no jugular venous distension.  LYMPH NODES:  Lymph nodes in the neck are not enlarged. RESPIRATORY:  Lungs are clear, and breath sounds are equal bilaterally. Normal respiratory effort without pathologic use of accessory muscles. CARDIOVASCULAR: Heart is regular in rate and rhythm. GI: The abdomen is notable for a supraumbilical mass that is non-tender to touch, attempts at complete reduction are limited by tenderness.  She has a readily apparent diastases recti.  Her abdomen is otherwise soft, nontender, and nondistended. There were no other palpable masses. MUSCULOSKELETAL:  Symmetrical muscle tone appreciated in all four extremities.    SKIN: Skin turgor is normal. No pathologic skin lesions appreciated.  NEUROLOGIC:  Motor and sensation appear grossly normal.  Cranial nerves are grossly without defect. PSYCH:  Alert and oriented to person, place and time. Affect is appropriate for situation.  Data Reviewed I have personally reviewed what is currently available of the patient's imaging, recent labs and medical records.   Labs:     Latest Ref Rng & Units 09/18/2021    8:44 AM 04/21/2021   10:49 AM 03/21/2021    7:33 AM  CBC  WBC 3.4 - 10.8 x10E3/uL 8.5  7.6  9.8   Hemoglobin 11.1 - 15.9 g/dL 14.8  14.3  15.1   Hematocrit 34.0 - 46.6 % 44.1  44.9  46.8   Platelets 150 - 450  x10E3/uL 214  241  250       Latest Ref Rng & Units 09/18/2021    8:44 AM 04/21/2021   10:49 AM 03/21/2021    7:33 AM  CMP  Glucose 70 - 99 mg/dL 116  103  113   BUN 6 - 24 mg/dL '10  12  11   '$ Creatinine 0.57 - 1.00 mg/dL 0.83  0.66  0.71   Sodium 134 - 144 mmol/L 142  136  140   Potassium 3.5 - 5.2 mmol/L 4.6  3.7  4.1   Chloride 96 - 106 mmol/L 105  103  107  CO2 20 - 29 mmol/L '22  27  25   '$ Calcium 8.7 - 10.2 mg/dL 9.6  9.2  9.3   Total Protein 6.0 - 8.5 g/dL 7.3  7.8  7.6   Total Bilirubin 0.0 - 1.2 mg/dL 0.4  0.6  0.5   Alkaline Phos 44 - 121 IU/L 141  119  113   AST 0 - 40 IU/L '14  22  18   '$ ALT 0 - 32 IU/L '14  27  19       '$ Imaging: Radiology review:  ADDENDUM REPORT: 03/21/2021 10:38   ADDENDUM: The midline hernia is actually supraumbilical, lying 3 cm above the umbilicus. This was not present on the prior CT.     Electronically Signed   By: Lajean Manes M.D.   On: 03/21/2021 10:38    Addended by Lajean Manes, MD on 03/21/2021 10:40 AM   Study Result  Narrative & Impression  CLINICAL DATA:  Triage note: Pt via POV from home. Pt c/o lower abd pain that radiates to her back that started yesterday, pt noted to have a umbilical hernia that she has not been dx with. States that it hurts her when she coughs. Denies NVD. Pt A&OX4 and NAD.   EXAM: CT ABDOMEN AND PELVIS WITH CONTRAST   TECHNIQUE: Multidetector CT imaging of the abdomen and pelvis was performed using the standard protocol following bolus administration of intravenous contrast.   RADIATION DOSE REDUCTION: This exam was performed according to the departmental dose-optimization program which includes automated exposure control, adjustment of the mA and/or kV according to patient size and/or use of iterative reconstruction technique.   CONTRAST:  176m OMNIPAQUE IOHEXOL 300 MG/ML  SOLN   COMPARISON:  04/28/2013   FINDINGS: Lower chest: No acute abnormality.   Hepatobiliary: Liver normal in  size and overall attenuation. There several low-density liver lesions, largest in the posterior aspect of segment 2, 1.8 cm, all consistent with cysts. No other liver lesions. Gallbladder is distended. There is hypoattenuating material, higher in attenuation than fluid, within the gallbladder, consistent with sludge. No radiopaque stones. No wall thickening or inflammation. No bile duct dilation.   Pancreas: Unremarkable. No pancreatic ductal dilatation or surrounding inflammatory changes.   Spleen: Normal in size without focal abnormality.   Adrenals/Urinary Tract: Adrenal glands are unremarkable. Kidneys are normal, without renal calculi, focal lesion, or hydronephrosis. Bladder is unremarkable.   Stomach/Bowel: Normal stomach. Small bowel and colon are normal in caliber. No wall thickening or inflammation. There is fat attenuation along the wall of the colon, nonspecific, finding that can be seen as result of remote inflammation. There are scattered left colon diverticula without diverticulitis. Normal appendix visualized.   Vascular/Lymphatic: Mild aortic atherosclerosis. No aneurysm. No enlarged lymph nodes.   Reproductive: Uterus and bilateral adnexa are unremarkable.   Other: Fat containing umbilical hernia base measuring 1.8 cm. No bowel enters this. Herniated fat is surrounded by a rim of increased attenuation and mild haziness in the adjacent subcutaneous fat suggesting inflammation.   No ascites.   Musculoskeletal: No fracture or acute finding.  No bone lesion.   IMPRESSION: 1. Small fat containing umbilical hernia associated with mild inflammatory changes. No bowel enters the hernia. 2. No other evidence of an acute abnormality. 3. Left colon diverticula without evidence of diverticulitis. 4. Aortic atherosclerosis.   Electronically Signed: By: DLajean ManesM.D. On: 03/21/2021 10:14   Within last 24 hrs: No results found.  Assessment     Patient  Active  Problem List   Diagnosis Date Noted   Morbid obesity ([place]) 03/26/2021   Incarcerated ventral hernia 03/26/2021    Plan    As she continues to be motivated to optimize her health, she is uninterested in proceeding with hernia repair at this time, we will hope to see a trend of weight loss at some point.  She notes that the hernia really does not bother her anymore as it did on initial presentation.  She desires to continue with this game plan we will see her back in 3 months.  Face-to-face time spent with the patient and accompanying care providers(if present) was 20 minutes, with more than 50% of the time spent counseling, educating, and coordinating care of the patient.    These notes generated with voice recognition software. I apologize for typographical errors.  Ronny Bacon M.D., FACS 04/20/2022, 3:01 PM

## 2022-04-20 ENCOUNTER — Ambulatory Visit (INDEPENDENT_AMBULATORY_CARE_PROVIDER_SITE_OTHER): Payer: 59 | Admitting: Surgery

## 2022-04-20 ENCOUNTER — Encounter: Payer: Self-pay | Admitting: Surgery

## 2022-04-20 VITALS — BP 113/75 | HR 87 | Temp 98.7°F | Ht 69.0 in | Wt 268.4 lb

## 2022-04-20 DIAGNOSIS — K436 Other and unspecified ventral hernia with obstruction, without gangrene: Secondary | ICD-10-CM

## 2022-04-20 DIAGNOSIS — F17210 Nicotine dependence, cigarettes, uncomplicated: Secondary | ICD-10-CM | POA: Diagnosis not present

## 2022-04-20 DIAGNOSIS — Z6839 Body mass index (BMI) 39.0-39.9, adult: Secondary | ICD-10-CM | POA: Diagnosis not present

## 2022-04-20 DIAGNOSIS — F17200 Nicotine dependence, unspecified, uncomplicated: Secondary | ICD-10-CM

## 2022-05-28 ENCOUNTER — Ambulatory Visit: Payer: Medicaid Other | Admitting: Physician Assistant

## 2022-05-28 ENCOUNTER — Encounter: Payer: Self-pay | Admitting: Physician Assistant

## 2022-05-28 VITALS — BP 119/102 | HR 91 | Temp 97.0°F | Resp 16 | Ht 69.0 in | Wt 269.4 lb

## 2022-05-28 DIAGNOSIS — I10 Essential (primary) hypertension: Secondary | ICD-10-CM | POA: Diagnosis not present

## 2022-05-28 DIAGNOSIS — E1159 Type 2 diabetes mellitus with other circulatory complications: Secondary | ICD-10-CM

## 2022-05-28 DIAGNOSIS — R5383 Other fatigue: Secondary | ICD-10-CM | POA: Diagnosis not present

## 2022-05-28 DIAGNOSIS — R252 Cramp and spasm: Secondary | ICD-10-CM

## 2022-05-28 DIAGNOSIS — M2550 Pain in unspecified joint: Secondary | ICD-10-CM | POA: Diagnosis not present

## 2022-05-28 DIAGNOSIS — E782 Mixed hyperlipidemia: Secondary | ICD-10-CM | POA: Diagnosis not present

## 2022-05-28 DIAGNOSIS — E538 Deficiency of other specified B group vitamins: Secondary | ICD-10-CM

## 2022-05-28 DIAGNOSIS — E559 Vitamin D deficiency, unspecified: Secondary | ICD-10-CM

## 2022-05-28 DIAGNOSIS — R946 Abnormal results of thyroid function studies: Secondary | ICD-10-CM | POA: Diagnosis not present

## 2022-05-28 DIAGNOSIS — F320 Major depressive disorder, single episode, mild: Secondary | ICD-10-CM

## 2022-05-28 LAB — POCT GLYCOSYLATED HEMOGLOBIN (HGB A1C): Hemoglobin A1C: 6.5 % — AB (ref 4.0–5.6)

## 2022-05-28 MED ORDER — METFORMIN HCL 500 MG PO TABS
500.0000 mg | ORAL_TABLET | Freq: Every day | ORAL | 3 refills | Status: DC
Start: 2022-05-28 — End: 2022-07-01

## 2022-05-28 MED ORDER — DULOXETINE HCL 20 MG PO CPEP
20.0000 mg | ORAL_CAPSULE | Freq: Every day | ORAL | 3 refills | Status: DC
Start: 2022-05-28 — End: 2023-02-07

## 2022-05-28 NOTE — Progress Notes (Signed)
[facility] [address] [place], [place] [id number]  Internal MEDICINE  Office Visit Note  Patient Name: Kelsey Sampson  [id number] 25, 2072  [id number]  Date of Service: 06/08/2022  Chief Complaint  Patient presents with   Hyperlipidemia   Hypertension   Follow-up    HPI Pt is here for routine follow up -Not sleeping well and feeling more depressed a few days ago, but this did improve some -Both legs and knees are bothering her, knee popping. Did try brace and had some swelling intermittently. Previously shoulder pain as well that she did PT for, but still can be bothersome. Given overall joint and muscle pain will check labs -Will try cymbalta to help with pain as well as depressed mood recently -has not taken bp meds today which is why BP is up, there was also an issue with her insurance which is additionally driving BP up -never heard about rybelsus still, likely denied. Would like to try an injection if metformin doesn't work but will start with metformin first  Current Medication: Outpatient Encounter Medications as of 05/28/2022  Medication Sig   Ascorbic Acid (VITAMIN C PO) Take 1 tablet by mouth daily.   DULoxetine (CYMBALTA) 20 MG capsule Take 1 capsule (20 mg total) by mouth daily.   hydrochlorothiazide (HYDRODIURIL) 25 MG tablet Take 1 tablet (25 mg total) by mouth daily.   ibuprofen (ADVIL) 200 MG tablet Take 600 mg by mouth every 6 (six) hours as needed for moderate pain or headache.   ibuprofen (ADVIL) 800 MG tablet Take 1 tablet (800 mg total) by mouth every 8 (eight) hours. For 3-5 days then every 8 hours as needed for moderate to severe pain   metFORMIN (GLUCOPHAGE) 500 MG tablet Take 1 tablet (500 mg total) by mouth daily with breakfast.   Multiple Vitamin (MULTIVITAMIN WITH MINERALS) TABS tablet Take 1 tablet by mouth daily.   Na Sulfate-K Sulfate-Mg Sulf 17.5-3.13-1.6 GM/177ML SOLN Take by mouth as directed.   nystatin-triamcinolone ointment (MYCOLOG) Apply 1  Application topically 2 (two) times daily.   rosuvastatin (CRESTOR) 5 MG tablet Take 1 tablet (5 mg total) by mouth daily.   Semaglutide (RYBELSUS) 3 MG TABS Take 1 tablet (3 mg total) by mouth daily.   traZODone (DESYREL) 50 MG tablet TAKE 1 TABLET BY MOUTH EVERYDAY AT BEDTIME   No facility-administered encounter medications on file as of 05/28/2022.    Surgical History: Past Surgical History:  Procedure Laterality Date   ABDOMINAL HYSTERECTOMY     COLONOSCOPY WITH PROPOFOL N/A 10/15/2021   Procedure: COLONOSCOPY WITH PROPOFOL;  Surgeon: Wyline MoodAnna, Kiran, MD;  [facility];  Service: Gastroenterology;  Laterality: N/A;   cyst removed Left    52 years old cyst removed    Medical History: Past Medical History:  Diagnosis Date   Abscess of buttock, left    Hyperlipidemia    Hypertension    Tobacco use disorder 03/26/2021    Family History: Family History  Problem Relation Age of Onset   Hypertension Mother     Social History   Socioeconomic History   Marital status: Single    Spouse name: Not on file   Number of children: Not on file   Years of education: Not on file   Highest education level: Not on file  Occupational History   Not on file  Tobacco Use   Smoking status: Every Day    Types: Cigarettes    Last attempt to quit: 11/30/2020    Years since  quitting: 1.5   Smokeless tobacco: Never   Tobacco comments:    1 pack every 2 days.  Vaping Use   Vaping Use: Some days   Substances: Flavoring  Substance and Sexual Activity   Alcohol use: No   Drug use: Not Currently    Types: Marijuana    Comment: over 30 days ago   Sexual activity: Not Currently    Partners: Male  Other Topics Concern   Not on file  Social History Narrative   Not on file   Social Determinants of Health   Financial Resource Strain: Not on file  Food Insecurity: Not on file  Transportation Needs: Not on file  Physical Activity: Not on file  Stress: Not on file  Social  Connections: Not on file  Intimate Partner Violence: Not At Risk (04/02/2021)   Humiliation, Afraid, Rape, and Kick questionnaire    Fear of Current or Ex-Partner: No    Emotionally Abused: No    Physically Abused: No    Sexually Abused: No      Review of Systems  Constitutional:  Negative for chills, fatigue and unexpected weight change.  HENT:  Negative for congestion, postnasal drip, rhinorrhea, sneezing and sore throat.   Eyes:  Negative for redness.  Respiratory:  Negative for cough, chest tightness and shortness of breath.   Cardiovascular:  Negative for chest pain and palpitations.  Gastrointestinal:  Negative for abdominal pain, constipation, diarrhea, nausea and vomiting.  Genitourinary:  Negative for dysuria and frequency.  Musculoskeletal:  Positive for arthralgias. Negative for joint swelling and neck pain.  Skin:  Negative for rash.  Neurological: Negative.  Negative for tremors and numbness.  Hematological:  Negative for adenopathy. Does not bruise/bleed easily.  Psychiatric/Behavioral:  Positive for sleep disturbance. Negative for behavioral problems (Depression) and suicidal ideas. The patient is not nervous/anxious.     Vital Signs: BP (!) 119/102   Pulse 91   Temp (!) 97 F (36.1 C)   Resp 16   Ht 5\' 9"  (1.753 m)   Wt 269 lb 6.4 oz (122.2 kg)   BMI 39.78 kg/m    Physical Exam Vitals and nursing note reviewed.  Constitutional:      General: She is not in acute distress.    Appearance: Normal appearance. She is well-developed. She is obese. She is not diaphoretic.  HENT:     Head: Normocephalic and atraumatic.     Mouth/Throat:     Pharynx: No oropharyngeal exudate.  Eyes:     Pupils: Pupils are equal, round, and reactive to light.  Neck:     Thyroid: No thyromegaly.     Vascular: No JVD.     Trachea: No tracheal deviation.  Cardiovascular:     Rate and Rhythm: Normal rate and regular rhythm.     Heart sounds: Normal heart sounds. No murmur  heard.    No friction rub. No gallop.  Pulmonary:     Effort: Pulmonary effort is normal. No respiratory distress.     Breath sounds: No wheezing or rales.  Chest:     Chest wall: No tenderness.  Abdominal:     General: Bowel sounds are normal.     Palpations: Abdomen is soft.  Musculoskeletal:        General: Normal range of motion.     Cervical back: Normal range of motion and neck supple.  Lymphadenopathy:     Cervical: No cervical adenopathy.  Skin:    General: Skin is warm and dry.  Neurological:     Mental Status: She is alert and oriented to person, place, and time.     Cranial Nerves: No cranial nerve deficit.  Psychiatric:        Behavior: Behavior normal.        Thought Content: Thought content normal.        Judgment: Judgment normal.        Assessment/Plan: 1. Type 2 diabetes mellitus with other circulatory complication, without long-term current use of insulin - POCT glycosylated hemoglobin (Hb A1C) is 6.5 which is improved from 6.7 last visit. Will start on metformin, will consider injection GLP1 if not tolerated/helping in future - metFORMIN (GLUCOPHAGE) 500 MG tablet; Take 1 tablet (500 mg total) by mouth daily with breakfast.  Dispense: 180 tablet; Refill: 3  2. Essential hypertension Very elevated in office due to being upset over insurance and not taking her meds. Will ensure she is taking this and monitoring  3. Muscle cramps Could be due to statin and will monitor - Magnesium - Comprehensive metabolic panel  4. Polyarthralgia Will check labs and start on cymbalta to see if this helps. May need rheumatology referral pending labs - Rheumatoid Factor - ANA w/Reflex if Positive - Sed Rate (ESR) - DULoxetine (CYMBALTA) 20 MG capsule; Take 1 capsule (20 mg total) by mouth daily.  Dispense: 30 capsule; Refill: 3  5. Depression, major, single episode, mild - DULoxetine (CYMBALTA) 20 MG capsule; Take 1 capsule (20 mg total) by mouth daily.  Dispense: 30  capsule; Refill: 3  6. Mixed hyperlipidemia - Lipid Panel With LDL/HDL Ratio  7. Vitamin D deficiency - VITAMIN D 25 Hydroxy (Vit-D Deficiency, Fractures)  8. B12 deficiency - B12 and Folate Panel  9. Abnormal thyroid exam - TSH + free T4  10. Other fatigue - Rheumatoid Factor - ANA w/Reflex if Positive - Sed Rate (ESR) - Lipid Panel With LDL/HDL Ratio - Magnesium - Comprehensive metabolic panel - VITAMIN D 25 Hydroxy (Vit-D Deficiency, Fractures) - TSH + free T4 - B12 and Folate Panel   General Counseling: Analeya verbalizes understanding of the findings of todays visit and agrees with plan of treatment. I have discussed any further diagnostic evaluation that may be needed or ordered today. We also reviewed her medications today. she has been encouraged to call the office with any questions or concerns that should arise related to todays visit.    Orders Placed This Encounter  Procedures   Rheumatoid Factor   ANA w/Reflex if Positive   Sed Rate (ESR)   Lipid Panel With LDL/HDL Ratio   Magnesium   Comprehensive metabolic panel   VITAMIN D 25 Hydroxy (Vit-D Deficiency, Fractures)   TSH + free T4   B12 and Folate Panel   POCT glycosylated hemoglobin (Hb A1C)    Meds ordered this encounter  Medications   metFORMIN (GLUCOPHAGE) 500 MG tablet    Sig: Take 1 tablet (500 mg total) by mouth daily with breakfast.    Dispense:  180 tablet    Refill:  3   DULoxetine (CYMBALTA) 20 MG capsule    Sig: Take 1 capsule (20 mg total) by mouth daily.    Dispense:  30 capsule    Refill:  3    This patient was seen by Lynn Ito, PA-C in collaboration with Dr. Beverely Risen as a part of collaborative care agreement.   Total time spent:30 Minutes Time spent includes review of chart, medications, test results, and follow up plan with the patient.  Dr Lavera Guise Internal medicine

## 2022-06-01 LAB — COMPREHENSIVE METABOLIC PANEL
ALT: 15 IU/L (ref 0–32)
AST: 15 IU/L (ref 0–40)
Albumin/Globulin Ratio: 1.5 (ref 1.2–2.2)
Albumin: 4.1 g/dL (ref 3.8–4.9)
Alkaline Phosphatase: 132 IU/L — ABNORMAL HIGH (ref 44–121)
BUN/Creatinine Ratio: 14 (ref 9–23)
BUN: 12 mg/dL (ref 6–24)
Bilirubin Total: 0.4 mg/dL (ref 0.0–1.2)
CO2: 25 mmol/L (ref 20–29)
Calcium: 9.4 mg/dL (ref 8.7–10.2)
Chloride: 100 mmol/L (ref 96–106)
Creatinine, Ser: 0.84 mg/dL (ref 0.57–1.00)
Globulin, Total: 2.7 g/dL (ref 1.5–4.5)
Glucose: 168 mg/dL — ABNORMAL HIGH (ref 70–99)
Potassium: 3.7 mmol/L (ref 3.5–5.2)
Sodium: 140 mmol/L (ref 134–144)
Total Protein: 6.8 g/dL (ref 6.0–8.5)
eGFR: 84 mL/min/{1.73_m2} (ref >59)

## 2022-06-01 LAB — RHEUMATOID FACTOR: Rheumatoid fact SerPl-aCnc: <10 IU/mL (ref <14.0)

## 2022-06-01 LAB — TSH+FREE T4
Free T4: 1.19 ng/dL (ref 0.82–1.77)
TSH: 1.74 u[IU]/mL (ref 0.450–4.500)

## 2022-06-01 LAB — ANA W/REFLEX IF POSITIVE
Anti JO-1: <0.2 AI (ref 0.0–0.9)
Anti Nuclear Antibody (ANA): POSITIVE — AB
Centromere Ab Screen: <0.2 AI (ref 0.0–0.9)
Chromatin Ab SerPl-aCnc: <0.2 AI (ref 0.0–0.9)
ENA RNP Ab: 7.7 AI — ABNORMAL HIGH (ref 0.0–0.9)
ENA SM Ab Ser-aCnc: <0.2 AI (ref 0.0–0.9)
ENA SSA (RO) Ab: 0.6 AI (ref 0.0–0.9)
ENA SSB (LA) Ab: <0.2 AI (ref 0.0–0.9)
Scleroderma (Scl-70) (ENA) Antibody, IgG: <0.2 AI (ref 0.0–0.9)
dsDNA Ab: <1 IU/mL (ref 0–9)

## 2022-06-01 LAB — B12 AND FOLATE PANEL
Folate: 19.4 ng/mL (ref >3.0)
Vitamin B-12: 431 pg/mL (ref 232–1245)

## 2022-06-01 LAB — LIPID PANEL WITH LDL/HDL RATIO
Cholesterol, Total: 142 mg/dL (ref 100–199)
HDL: 34 mg/dL — ABNORMAL LOW (ref >39)
LDL Chol Calc (NIH): 73 mg/dL (ref 0–99)
LDL/HDL Ratio: 2.1 ratio (ref 0.0–3.2)
Triglycerides: 208 mg/dL — ABNORMAL HIGH (ref 0–149)
VLDL Cholesterol Cal: 35 mg/dL (ref 5–40)

## 2022-06-01 LAB — MAGNESIUM: Magnesium: 1.7 mg/dL (ref 1.6–2.3)

## 2022-06-01 LAB — VITAMIN D 25 HYDROXY (VIT D DEFICIENCY, FRACTURES): Vit D, 25-Hydroxy: 51.2 ng/mL (ref 30.0–100.0)

## 2022-06-01 LAB — SEDIMENTATION RATE: Sed Rate: 9 mm/hr (ref 0–40)

## 2022-06-04 ENCOUNTER — Other Ambulatory Visit: Payer: Self-pay | Admitting: Physician Assistant

## 2022-06-04 DIAGNOSIS — M2550 Pain in unspecified joint: Secondary | ICD-10-CM

## 2022-06-04 DIAGNOSIS — R768 Other specified abnormal immunological findings in serum: Secondary | ICD-10-CM

## 2022-06-07 ENCOUNTER — Telehealth: Payer: Self-pay

## 2022-06-07 ENCOUNTER — Telehealth: Payer: Self-pay | Admitting: Physician Assistant

## 2022-06-07 NOTE — Telephone Encounter (Signed)
-----   Message from Carlean Jews, PA-C sent at 06/04/2022 12:38 PM EDT ----- Please let her know that her ANA which is an autoimmune marker was positive and I am referring her to rheumatology to address this as this could be part of what is causing her pain. Cholesterol and vit D are much better. B12 on low end of normal, but a little better than before.

## 2022-06-07 NOTE — Telephone Encounter (Signed)
Spoke with patient regarding labs.

## 2022-06-07 NOTE — Telephone Encounter (Signed)
Awaiting 05/28/22 office notes for Rheumatology referral-Toni

## 2022-06-09 ENCOUNTER — Telehealth: Payer: Self-pay | Admitting: Physician Assistant

## 2022-06-09 NOTE — Telephone Encounter (Signed)
Rheumatology referral sent via [facility] to [facility]-Toni 

## 2022-06-12 ENCOUNTER — Other Ambulatory Visit: Payer: Self-pay | Admitting: Physician Assistant

## 2022-06-12 DIAGNOSIS — M2550 Pain in unspecified joint: Secondary | ICD-10-CM

## 2022-06-12 DIAGNOSIS — F320 Major depressive disorder, single episode, mild: Secondary | ICD-10-CM

## 2022-07-01 ENCOUNTER — Encounter: Payer: Self-pay | Admitting: Physician Assistant

## 2022-07-01 ENCOUNTER — Ambulatory Visit: Payer: Medicaid Other | Admitting: Physician Assistant

## 2022-07-01 VITALS — BP 125/80 | HR 96 | Temp 97.8°F | Resp 16 | Ht 69.0 in | Wt 270.0 lb

## 2022-07-01 DIAGNOSIS — I10 Essential (primary) hypertension: Secondary | ICD-10-CM

## 2022-07-01 DIAGNOSIS — E1159 Type 2 diabetes mellitus with other circulatory complications: Secondary | ICD-10-CM | POA: Diagnosis not present

## 2022-07-01 DIAGNOSIS — M2550 Pain in unspecified joint: Secondary | ICD-10-CM | POA: Diagnosis not present

## 2022-07-01 MED ORDER — METFORMIN HCL 500 MG PO TABS
500.0000 mg | ORAL_TABLET | Freq: Every day | ORAL | 1 refills | Status: DC
Start: 2022-07-01 — End: 2023-02-07

## 2022-07-01 MED ORDER — METFORMIN HCL 500 MG PO TABS: 500.0000 mg | ORAL_TABLET | Freq: Every day | ORAL | 3 refills | Status: DC

## 2022-07-01 NOTE — Progress Notes (Signed)
[facility] [address] [place], [place] [address]  Internal MEDICINE  Office Visit Note  Patient Name: Kelsey Sampson  [id number]  [id number]  Date of Service: 07/07/2022  Chief Complaint  Patient presents with   Follow-up   Hyperlipidemia   Hypertension    HPI Pt is here for routine follow up -Went to rheumatology this morning and had more blood work drawn and imaging done -Is taking cymbalta, but still having pain and this is what she is seeing rheum for now -never got metformin, was told it would be over $100 and will resend to check this or use goodrx if needed. If not tolerated then may consider alternative. Advised to call if unable to fill script or tolerate med -Following up with [facility] in another 4 months to plan for hernia surgery  Current Medication: Outpatient Encounter Medications as of 07/01/2022  Medication Sig   Ascorbic Acid (VITAMIN C PO) Take 1 tablet by mouth daily.   DULoxetine (CYMBALTA) 20 MG capsule Take 1 capsule (20 mg total) by mouth daily.   hydrochlorothiazide (HYDRODIURIL) 25 MG tablet Take 1 tablet (25 mg total) by mouth daily.   ibuprofen (ADVIL) 200 MG tablet Take 600 mg by mouth every 6 (six) hours as needed for moderate pain or headache.   ibuprofen (ADVIL) 800 MG tablet Take 1 tablet (800 mg total) by mouth every 8 (eight) hours. For 3-5 days then every 8 hours as needed for moderate to severe pain   Multiple Vitamin (MULTIVITAMIN WITH MINERALS) TABS tablet Take 1 tablet by mouth daily.   Na Sulfate-K Sulfate-Mg Sulf 17.5-3.13-1.6 GM/177ML SOLN Take by mouth as directed.   nystatin-triamcinolone ointment (MYCOLOG) Apply 1 Application topically 2 (two) times daily.   rosuvastatin (CRESTOR) 5 MG tablet Take 1 tablet (5 mg total) by mouth daily.   traZODone (DESYREL) 50 MG tablet TAKE 1 TABLET BY MOUTH EVERYDAY AT BEDTIME   [DISCONTINUED] metFORMIN (GLUCOPHAGE) 500 MG tablet Take 1 tablet (500 mg total) by mouth daily with breakfast.    [DISCONTINUED] Semaglutide (RYBELSUS) 3 MG TABS Take 1 tablet (3 mg total) by mouth daily.   metFORMIN (GLUCOPHAGE) 500 MG tablet Take 1 tablet (500 mg total) by mouth daily with breakfast.   [DISCONTINUED] metFORMIN (GLUCOPHAGE) 500 MG tablet Take 1 tablet (500 mg total) by mouth daily with breakfast.   No facility-administered encounter medications on file as of 07/01/2022.    Surgical History: Past Surgical History:  Procedure Laterality Date   ABDOMINAL HYSTERECTOMY     COLONOSCOPY WITH PROPOFOL N/A 10/15/2021   Procedure: COLONOSCOPY WITH PROPOFOL;  Surgeon: Wyline Mood, MD;  Location: [facility] ENDOSCOPY;  Service: Gastroenterology;  Laterality: N/A;   cyst removed Left    52 years old cyst removed    Medical History: Past Medical History:  Diagnosis Date   Abscess of buttock, left    Hyperlipidemia    Hypertension    Tobacco use disorder 03/26/2021    Family History: Family History  Problem Relation Age of Onset   Hypertension Mother     Social History   Socioeconomic History   Marital status: Single    Spouse name: Not on file   Number of children: Not on file   Years of education: Not on file   Highest education level: Not on file  Occupational History   Not on file  Tobacco Use   Smoking status: Every Day    Types: Cigarettes    Last attempt to quit: 11/30/2020  Years since quitting: 1.6   Smokeless tobacco: Never   Tobacco comments:    1 pack every 2 days.  Vaping Use   Vaping Use: Some days   Substances: Flavoring  Substance and Sexual Activity   Alcohol use: No   Drug use: Not Currently    Types: Marijuana    Comment: over 30 days ago   Sexual activity: Not Currently    Partners: Male  Other Topics Concern   Not on file  Social History Narrative   Not on file   Social Determinants of Health   Financial Resource Strain: Not on file  Food Insecurity: Not on file  Transportation Needs: Not on file  Physical Activity: Not on file  Stress: Not  on file  Social Connections: Not on file  Intimate Partner Violence: Not At Risk (04/02/2021)   Humiliation, Afraid, Rape, and Kick questionnaire    Fear of Current or Ex-Partner: No    Emotionally Abused: No    Physically Abused: No    Sexually Abused: No      Review of Systems  Constitutional:  Negative for chills, fatigue and unexpected weight change.  HENT:  Negative for congestion, postnasal drip, rhinorrhea, sneezing and sore throat.   Eyes:  Negative for redness.  Respiratory:  Negative for cough, chest tightness and shortness of breath.   Cardiovascular:  Negative for chest pain and palpitations.  Gastrointestinal:  Negative for abdominal pain, constipation, diarrhea, nausea and vomiting.  Genitourinary:  Negative for dysuria and frequency.  Musculoskeletal:  Positive for arthralgias. Negative for joint swelling and neck pain.  Skin:  Negative for rash.  Neurological: Negative.  Negative for tremors and numbness.  Hematological:  Negative for adenopathy. Does not bruise/bleed easily.  Psychiatric/Behavioral:  Positive for sleep disturbance. Negative for behavioral problems (Depression) and suicidal ideas. The patient is not nervous/anxious.     Vital Signs: BP 125/80   Pulse 96   Temp 97.8 F (36.6 C)   Resp 16   Ht 5\' 9"  (1.753 m)   Wt 270 lb (122.5 kg)   SpO2 96%   BMI 39.87 kg/m    Physical Exam Vitals and nursing note reviewed.  Constitutional:      General: She is not in acute distress.    Appearance: Normal appearance. She is well-developed. She is obese. She is not diaphoretic.  HENT:     Head: Normocephalic and atraumatic.     Mouth/Throat:     Pharynx: No oropharyngeal exudate.  Eyes:     Pupils: Pupils are equal, round, and reactive to light.  Neck:     Thyroid: No thyromegaly.     Vascular: No JVD.     Trachea: No tracheal deviation.  Cardiovascular:     Rate and Rhythm: Normal rate and regular rhythm.     Heart sounds: Normal heart sounds. No  murmur heard.    No friction rub. No gallop.  Pulmonary:     Effort: Pulmonary effort is normal. No respiratory distress.     Breath sounds: No wheezing or rales.  Chest:     Chest wall: No tenderness.  Abdominal:     General: Bowel sounds are normal.     Palpations: Abdomen is soft.  Musculoskeletal:        General: Normal range of motion.     Cervical back: Normal range of motion and neck supple.  Lymphadenopathy:     Cervical: No cervical adenopathy.  Skin:    General: Skin is warm  and dry.  Neurological:     Mental Status: She is alert and oriented to person, place, and time.     Cranial Nerves: No cranial nerve deficit.  Psychiatric:        Behavior: Behavior normal.        Thought Content: Thought content normal.        Judgment: Judgment normal.        Assessment/Plan: 1. Type 2 diabetes mellitus with other circulatory complication, without long-term current use of insulin (HCC) Will resend metformin, advised to call if unable to fill script or can't tolerate, in which case may try alternative - Urine Microalbumin w/creat. ratio - metFORMIN (GLUCOPHAGE) 500 MG tablet; Take 1 tablet (500 mg total) by mouth daily with breakfast.  Dispense: 60 tablet; Refill: 1  2. Polyarthralgia Continue cymbalta and will follow up with rheumatology now  3. Essential hypertension Well controlled, continue current medication   General Counseling: Oliana verbalizes understanding of the findings of todays visit and agrees with plan of treatment. I have discussed any further diagnostic evaluation that may be needed or ordered today. We also reviewed her medications today. she has been encouraged to call the office with any questions or concerns that should arise related to todays visit.    Orders Placed This Encounter  Procedures   Urine Microalbumin w/creat. ratio    Meds ordered this encounter  Medications   DISCONTD: metFORMIN (GLUCOPHAGE) 500 MG tablet    Sig: Take 1 tablet  (500 mg total) by mouth daily with breakfast.    Dispense:  180 tablet    Refill:  3   metFORMIN (GLUCOPHAGE) 500 MG tablet    Sig: Take 1 tablet (500 mg total) by mouth daily with breakfast.    Dispense:  60 tablet    Refill:  1    This patient was seen by Lynn Ito, PA-C in collaboration with Dr. Beverely Risen as a part of collaborative care agreement.   Total time spent:30 Minutes Time spent includes review of chart, medications, test results, and follow up plan with the patient.      Dr Lyndon Code Internal medicine

## 2022-08-05 ENCOUNTER — Other Ambulatory Visit: Payer: Self-pay | Admitting: Physician Assistant

## 2022-08-05 ENCOUNTER — Encounter: Payer: Self-pay | Admitting: Emergency Medicine

## 2022-08-05 ENCOUNTER — Emergency Department
Admission: EM | Admit: 2022-08-05 | Discharge: 2022-08-05 | Disposition: A | Discharge status: Home / Self Care | Payer: 59 | Attending: Emergency Medicine | Admitting: Emergency Medicine

## 2022-08-05 ENCOUNTER — Emergency Department: Payer: 59

## 2022-08-05 ENCOUNTER — Other Ambulatory Visit: Payer: Self-pay

## 2022-08-05 DIAGNOSIS — I10 Essential (primary) hypertension: Secondary | ICD-10-CM | POA: Insufficient documentation

## 2022-08-05 DIAGNOSIS — Z7984 Long term (current) use of oral hypoglycemic drugs: Secondary | ICD-10-CM | POA: Insufficient documentation

## 2022-08-05 DIAGNOSIS — R600 Localized edema: Secondary | ICD-10-CM | POA: Diagnosis not present

## 2022-08-05 DIAGNOSIS — R918 Other nonspecific abnormal finding of lung field: Secondary | ICD-10-CM | POA: Diagnosis not present

## 2022-08-05 DIAGNOSIS — M7989 Other specified soft tissue disorders: Secondary | ICD-10-CM | POA: Insufficient documentation

## 2022-08-05 DIAGNOSIS — E876 Hypokalemia: Secondary | ICD-10-CM | POA: Diagnosis not present

## 2022-08-05 DIAGNOSIS — E119 Type 2 diabetes mellitus without complications: Secondary | ICD-10-CM | POA: Insufficient documentation

## 2022-08-05 DIAGNOSIS — E782 Mixed hyperlipidemia: Secondary | ICD-10-CM

## 2022-08-05 LAB — CBC
HCT: 44.2 % (ref 36.0–46.0)
Hemoglobin: 14.7 g/dL (ref 12.0–15.0)
MCH: 29.9 pg (ref 26.0–34.0)
MCHC: 33.3 g/dL (ref 30.0–36.0)
MCV: 89.8 fL (ref 80.0–100.0)
Platelets: 265 10*3/uL (ref 150–400)
RBC: 4.92 MIL/uL (ref 3.87–5.11)
RDW: 13.7 % (ref 11.5–15.5)
WBC: 10.2 10*3/uL (ref 4.0–10.5)
nRBC: 0 % (ref 0.0–0.2)

## 2022-08-05 LAB — HEPATIC FUNCTION PANEL
ALT: 20 U/L (ref 0–44)
AST: 26 U/L (ref 15–41)
Albumin: 4.2 g/dL (ref 3.5–5.0)
Alkaline Phosphatase: 97 U/L (ref 38–126)
Bilirubin, Direct: 0.1 mg/dL (ref 0.0–0.2)
Indirect Bilirubin: 0.7 mg/dL (ref 0.3–0.9)
Total Bilirubin: 0.8 mg/dL (ref 0.3–1.2)
Total Protein: 7.4 g/dL (ref 6.5–8.1)

## 2022-08-05 LAB — BASIC METABOLIC PANEL
Anion gap: 10 (ref 5–15)
BUN: 13 mg/dL (ref 6–20)
CO2: 25 mmol/L (ref 22–32)
Calcium: 9.3 mg/dL (ref 8.9–10.3)
Chloride: 103 mmol/L (ref 98–111)
Creatinine, Ser: 0.81 mg/dL (ref 0.44–1.00)
GFR, Estimated: >60 mL/min (ref >60)
Glucose, Bld: 112 mg/dL — ABNORMAL HIGH (ref 70–99)
Potassium: 3 mmol/L — ABNORMAL LOW (ref 3.5–5.1)
Sodium: 138 mmol/L (ref 135–145)

## 2022-08-05 LAB — MAGNESIUM: Magnesium: 1.8 mg/dL (ref 1.7–2.4)

## 2022-08-05 LAB — BRAIN NATRIURETIC PEPTIDE: B Natriuretic Peptide: 11 pg/mL (ref 0.0–100.0)

## 2022-08-05 MED ORDER — POTASSIUM CHLORIDE CRYS ER 20 MEQ PO TBCR
40.0000 meq | EXTENDED_RELEASE_TABLET | Freq: Once | ORAL | Status: AC
  Administered 2022-08-05: 40 meq via ORAL
  Filled 2022-08-05: qty 2

## 2022-08-05 NOTE — ED Provider Notes (Signed)
[facility] Emergency Department Provider Note     Event Date/Time   First MD Initiated Contact with Patient 08/05/22 1642     (approximate)   History   Leg Swelling   HPI  Kelsey Sampson is a 52 y.o. female with a history of obesity, chronic ventral hernia, HTN, HLD, diabetes, presents to the ED for evaluation of bilateral lower extremity edema.  Patient notes she started on her initial dose of metformin on Monday, and by Tuesday she began to experience swelling to the lower legs.  She denies any chest pain, shortness of breath, cough, or congestion.  She is aware of fluctuations in her weight and has prescription for HCTZ, but denies any history of heart failure.   Physical Exam   Triage Vital Signs: ED Triage Vitals  Enc Vitals Group     BP 08/05/22 1610 128/80     Pulse Rate 08/05/22 1610 94     Resp 08/05/22 1610 18     Temp 08/05/22 1610 98.7 F (37.1 C)     Temp src --      SpO2 08/05/22 1610 96 %     Weight 08/05/22 1609 270 lb (122.5 kg)     Height 08/05/22 1609 5\' 9"  (1.753 m)     Head Circumference --      Peak Flow --      Pain Score 08/05/22 1609 10     Pain Loc --      Pain Edu? --      Excl. in GC? --     Most recent vital signs: Vitals:   08/05/22 1610 08/05/22 1919  BP: 128/80 131/83  Pulse: 94 88  Resp: 18 18  Temp: 98.7 F (37.1 C)   SpO2: 96% 97%    General Awake, no distress. NAD CV:  Good peripheral perfusion. RRR 1+ pitting edema to the BLE RESP:  Normal effort. CTA. No crackles, rales, or wheezing ABD:  No distention. Soft, nontender MSK:  Active range of motion of the lower extremities bilaterally.   ED Results / Procedures / Treatments   Labs (all labs ordered are listed, but only abnormal results are displayed) Labs Reviewed  BASIC METABOLIC PANEL - Abnormal; Notable for the following components:      Result Value   Potassium 3.0 (*)    Glucose, Bld 112 (*)    All other components within normal  limits  CBC  BRAIN NATRIURETIC PEPTIDE  MAGNESIUM  HEPATIC FUNCTION PANEL    EKG   RADIOLOGY  I personally viewed and evaluated these images as part of my medical decision making, as well as reviewing the written report by the radiologist.  ED Provider Interpretation: no acute findings  DG Chest 2 View  Result Date: 08/05/2022 CLINICAL DATA:  Bilateral lower extremity edema EXAM: CHEST - 2 VIEW COMPARISON:  X-ray 12/06/2013 FINDINGS: Minimal linear opacity left lung base likely scar or atelectasis. No consolidation, pneumothorax or effusion. Normal cardiopericardial silhouette without edema. IMPRESSION: Minimal linear opacity left lung base likely scar or atelectasis. Electronically Signed   By: Karen Kays M.D.   On: 08/05/2022 18:03     PROCEDURES:  Critical Care performed: No  Procedures   MEDICATIONS ORDERED IN ED: Medications  potassium chloride SA (KLOR-CON M) CR tablet 40 mEq (40 mEq Oral Given 08/05/22 1729)     IMPRESSION / MDM / ASSESSMENT AND PLAN / ED COURSE  I reviewed the triage vital signs and the nursing  notes.                              Differential diagnosis includes, but is not limited to,  pneumonia, CHF including exacerbation with or without pulmonary/interstitial edema, pneumothorax, pulmonary embolism, dependent edema, electrolyte abnormality, hepatic failure, medication reaction  Patient's presentation is most consistent with acute complicated illness / injury requiring diagnostic workup.  Patient's diagnosis is consistent with bilateral peripheral edema.  Patient with an otherwise reassuring exam and workup at this time.  No evidence of acute heart failure, severe electrolyte abnormality, or hepatic dysfunction.  To be mildly hypokalemia 3.0, corrected with oral potassium supplement in the ED.  Chest x-ray did not reveal any evidence of pulmonary edema or fluid overload. Patient is to follow up with her primary provider as needed or otherwise  directed. Patient is given ED precautions to return to the ED for any worsening or new symptoms.  FINAL CLINICAL IMPRESSION(S) / ED DIAGNOSES   Final diagnoses:  Bilateral lower extremity edema  Hypokalemia     Rx / DC Orders   ED Discharge Orders     None        Note:  This document was prepared using Dragon voice recognition software and may include unintentional dictation errors.    Lissa Hoard, PA-C 08/05/22 1936    Jene Every, MD 08/06/22 [phone]

## 2022-08-05 NOTE — Discharge Instructions (Addendum)
Your exam, labs, and chest x-ray are normal and reassuring this time.  No signs of any heart failure or fluid overload in the lungs.  You can consider increasing your HCTZ over the next few days and monitoring blood pressure in the interim.  Your potassium was a little bit low, but was supplemented with a dose in the ED.  Follow-up with your primary provider for ongoing evaluation management.  Return to the ED if needed.

## 2022-08-05 NOTE — ED Triage Notes (Signed)
Pt to ED for bilateral leg swelling started yesterday. Denies shob. Reports is painful.  Recently started taking metformin

## 2022-08-18 DIAGNOSIS — M797 Fibromyalgia: Secondary | ICD-10-CM | POA: Diagnosis not present

## 2022-08-27 ENCOUNTER — Other Ambulatory Visit: Payer: Self-pay

## 2022-08-27 ENCOUNTER — Emergency Department
Admission: EM | Admit: 2022-08-27 | Discharge: 2022-08-27 | Disposition: A | Discharge status: Home / Self Care | Payer: 59 | Attending: Emergency Medicine | Admitting: Emergency Medicine

## 2022-08-27 DIAGNOSIS — M545 Low back pain, unspecified: Secondary | ICD-10-CM | POA: Insufficient documentation

## 2022-08-27 DIAGNOSIS — I10 Essential (primary) hypertension: Secondary | ICD-10-CM | POA: Insufficient documentation

## 2022-08-27 DIAGNOSIS — G8929 Other chronic pain: Secondary | ICD-10-CM | POA: Insufficient documentation

## 2022-08-27 DIAGNOSIS — M5459 Other low back pain: Secondary | ICD-10-CM | POA: Diagnosis not present

## 2022-08-27 LAB — URINALYSIS, ROUTINE W REFLEX MICROSCOPIC
Bilirubin Urine: NEGATIVE
Glucose, UA: NEGATIVE mg/dL
Hgb urine dipstick: NEGATIVE
Ketones, ur: 5 mg/dL — AB
Leukocytes,Ua: NEGATIVE
Nitrite: NEGATIVE
Protein, ur: 30 mg/dL — AB
Specific Gravity, Urine: 1.034 — ABNORMAL HIGH (ref 1.005–1.030)
pH: 6 (ref 5.0–8.0)

## 2022-08-27 MED ORDER — IBUPROFEN 600 MG PO TABS
600.0000 mg | ORAL_TABLET | Freq: Once | ORAL | Status: AC
  Administered 2022-08-27: 600 mg via ORAL
  Filled 2022-08-27: qty 1

## 2022-08-27 MED ORDER — MELOXICAM 15 MG PO TABS: 15.0000 mg | ORAL_TABLET | Freq: Every day | ORAL | 0 refills | Status: AC

## 2022-08-27 MED ORDER — CYCLOBENZAPRINE HCL 10 MG PO TABS: 10.0000 mg | ORAL_TABLET | Freq: Three times a day (TID) | ORAL | 0 refills | Status: DC | PRN

## 2022-08-27 NOTE — ED Notes (Signed)
Pt c/o of chronic lower back pain that has gotten worse, see also triage note.

## 2022-08-27 NOTE — Discharge Instructions (Addendum)
Take 1000 mg of Tylenol every 8 hours as needed for pain.  Do not exceed 3000 mg of Tylenol in a 24-hour period.   Take the meloxicam once a day for 2 weeks.  Do not take ibuprofen while you are taking the meloxicam.    You can take the cyclobenzaprine 3 times a day as needed, however this medication will make you sleepy so do not drive while taking it.
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
## 2022-08-30 ENCOUNTER — Encounter: Payer: Self-pay | Admitting: Nurse Practitioner

## 2022-08-30 ENCOUNTER — Ambulatory Visit (INDEPENDENT_AMBULATORY_CARE_PROVIDER_SITE_OTHER): Payer: Medicaid Other | Admitting: Nurse Practitioner

## 2022-08-30 VITALS — BP 130/88 | HR 100 | Temp 98.1°F | Resp 16 | Ht 69.0 in | Wt 264.4 lb

## 2022-08-30 DIAGNOSIS — G8929 Other chronic pain: Secondary | ICD-10-CM | POA: Diagnosis not present

## 2022-08-30 DIAGNOSIS — R600 Localized edema: Secondary | ICD-10-CM | POA: Diagnosis not present

## 2022-08-30 DIAGNOSIS — E876 Hypokalemia: Secondary | ICD-10-CM

## 2022-08-30 DIAGNOSIS — M545 Low back pain, unspecified: Secondary | ICD-10-CM

## 2022-08-30 NOTE — Progress Notes (Unsigned)
[facility] [address] [place], [place] [address]  Internal MEDICINE  Office Visit Note  Patient Name: Kelsey Sampson  [id number]  [id number]  Date of Service: 08/30/2022  Chief Complaint  Patient presents with   Follow-up    ED f/u back pain    HPI Kelsey Sampson presents for a follow-up visit for back pain and leg swelling  ED visit for back pain w/o sciatica -- her back is feeling better with the muscle relaxer and NSAID.  ED visit for lower extremity edema. -- taking hydrochlorothiazide 25 mg daily.  Potassium is 3.0, has OTC potassium supplement now.     Current Medication: Outpatient Encounter Medications as of 08/30/2022  Medication Sig   Ascorbic Acid (VITAMIN C PO) Take 1 tablet by mouth daily.   cyclobenzaprine (FLEXERIL) 10 MG tablet Take 1 tablet (10 mg total) by mouth 3 (three) times daily as needed for muscle spasms.   DULoxetine (CYMBALTA) 20 MG capsule Take 1 capsule (20 mg total) by mouth daily.   hydrochlorothiazide (HYDRODIURIL) 25 MG tablet Take 1 tablet (25 mg total) by mouth daily.   ibuprofen (ADVIL) 200 MG tablet Take 600 mg by mouth every 6 (six) hours as needed for moderate pain or headache.   meloxicam (MOBIC) 15 MG tablet Take 1 tablet (15 mg total) by mouth daily for 14 days.   metFORMIN (GLUCOPHAGE) 500 MG tablet Take 1 tablet (500 mg total) by mouth daily with breakfast.   Multiple Vitamin (MULTIVITAMIN WITH MINERALS) TABS tablet Take 1 tablet by mouth daily.   Na Sulfate-K Sulfate-Mg Sulf 17.5-3.13-1.6 GM/177ML SOLN Take by mouth as directed.   nystatin-triamcinolone ointment (MYCOLOG) Apply 1 Application topically 2 (two) times daily.   rosuvastatin (CRESTOR) 5 MG tablet TAKE 1 TABLET (5 MG TOTAL) BY MOUTH DAILY.   traZODone (DESYREL) 50 MG tablet TAKE 1 TABLET BY MOUTH EVERYDAY AT BEDTIME   [DISCONTINUED] ibuprofen (ADVIL) 800 MG tablet Take 1 tablet (800 mg total) by mouth every 8 (eight) hours. For 3-5 days then every 8 hours as needed for  moderate to severe pain   No facility-administered encounter medications on file as of 08/30/2022.    Surgical History: Past Surgical History:  Procedure Laterality Date   ABDOMINAL HYSTERECTOMY     COLONOSCOPY WITH PROPOFOL N/A 10/15/2021   Procedure: COLONOSCOPY WITH PROPOFOL;  Surgeon: Wyline Mood, MD;  Location: [facility] ENDOSCOPY;  Service: Gastroenterology;  Laterality: N/A;   cyst removed Left    52 years old cyst removed    Medical History: Past Medical History:  Diagnosis Date   Abscess of buttock, left    Hyperlipidemia    Hypertension    Tobacco use disorder 03/26/2021    Family History: Family History  Problem Relation Age of Onset   Hypertension Mother     Social History   Socioeconomic History   Marital status: Single    Spouse name: Not on file   Number of children: Not on file   Years of education: Not on file   Highest education level: Not on file  Occupational History   Not on file  Tobacco Use   Smoking status: Every Day    Types: Cigarettes    Last attempt to quit: 11/30/2020    Years since quitting: 1.7   Smokeless tobacco: Never   Tobacco comments:    1 pack every 2 days.  Vaping Use   Vaping Use: Some days   Substances: Flavoring  Substance and Sexual Activity   Alcohol  use: No   Drug use: Not Currently    Types: Marijuana    Comment: over 30 days ago   Sexual activity: Not Currently    Partners: Male  Other Topics Concern   Not on file  Social History Narrative   Not on file   Social Determinants of Health   Financial Resource Strain: Not on file  Food Insecurity: Not on file  Transportation Needs: Not on file  Physical Activity: Not on file  Stress: Not on file  Social Connections: Not on file  Intimate Partner Violence: Not At Risk (04/02/2021)   Humiliation, Afraid, Rape, and Kick questionnaire    Fear of Current or Ex-Partner: No    Emotionally Abused: No    Physically Abused: No    Sexually Abused: No      Review of  Systems  Constitutional:  Negative for chills, fatigue and unexpected weight change.  HENT:  Negative for congestion, postnasal drip, rhinorrhea, sneezing and sore throat.   Eyes:  Negative for redness.  Respiratory: Negative.  Negative for cough, chest tightness, shortness of breath and wheezing.   Cardiovascular:  Positive for leg swelling. Negative for chest pain and palpitations.  Gastrointestinal:  Negative for abdominal pain, constipation, diarrhea, nausea and vomiting.  Genitourinary:  Negative for dysuria and frequency.  Musculoskeletal:  Positive for arthralgias and back pain. Negative for joint swelling and neck pain.  Skin:  Negative for rash.  Neurological: Negative.  Negative for tremors and numbness.  Hematological:  Negative for adenopathy. Does not bruise/bleed easily.  Psychiatric/Behavioral:  Negative for behavioral problems (Depression), self-injury, sleep disturbance and suicidal ideas. The patient is not nervous/anxious.     Vital Signs: BP 130/88   Pulse 100   Temp 98.1 F (36.7 C)   Resp 16   Ht 5\' 9"  (1.753 m)   Wt 264 lb 6.4 oz (119.9 kg)   SpO2 96%   BMI 39.05 kg/m    Physical Exam Vitals reviewed.  Constitutional:      General: She is not in acute distress.    Appearance: Normal appearance. She is obese. She is not ill-appearing.  HENT:     Head: Normocephalic and atraumatic.  Eyes:     Pupils: Pupils are equal, round, and reactive to light.  Cardiovascular:     Rate and Rhythm: Normal rate and regular rhythm.  Pulmonary:     Effort: Pulmonary effort is normal. No respiratory distress.  Musculoskeletal:     Right lower leg: Edema present.     Left lower leg: Edema present.  Neurological:     Mental Status: She is alert and oriented to person, place, and time.  Psychiatric:        Mood and Affect: Mood normal.        Behavior: Behavior normal.        Assessment/Plan: 1. Chronic bilateral low back pain without sciatica Continue  meloxicam and cyclobenzaprine as prescribed.   2. Bilateral lower extremity edema Continue hydrochlorothiazide as prescribed. Will consider adding another prn diuretic pending potassium level.   3. Hypokalemia Repeat potassium. Take 2 tablets of the OTC potassium supplement twice daily. - Potassium   General Counseling: Kelsey Sampson verbalizes understanding of the findings of todays visit and agrees with plan of treatment. I have discussed any further diagnostic evaluation that may be needed or ordered today. We also reviewed her medications today. she has been encouraged to call the office with any questions or concerns that should arise related to todays  visit.    Orders Placed This Encounter  Procedures   Potassium    No orders of the defined types were placed in this encounter.   Return if symptoms worsen or fail to improve, for and keep regular follow up with Lauren PA-C.   Total time spent:30 Minutes Time spent includes review of chart, medications, test results, and follow up plan with the patient.   [place] Controlled Substance Database was reviewed by me.  This patient was seen by Sallyanne Kuster, FNP-C in collaboration with Dr. Beverely Risen as a part of collaborative care agreement.   Douglas Rooks R. Tedd Sias, MSN, FNP-C Internal medicine

## 2022-08-31 ENCOUNTER — Encounter: Payer: Self-pay | Admitting: Nurse Practitioner

## 2022-09-13 ENCOUNTER — Other Ambulatory Visit: Payer: Self-pay | Admitting: Physician Assistant

## 2022-09-13 DIAGNOSIS — I10 Essential (primary) hypertension: Secondary | ICD-10-CM

## 2022-10-04 ENCOUNTER — Ambulatory Visit: Payer: Medicaid Other | Admitting: Physician Assistant

## 2022-10-04 ENCOUNTER — Encounter: Payer: Self-pay | Admitting: Physician Assistant

## 2022-10-04 VITALS — BP 110/80 | HR 91 | Temp 98.3°F | Resp 16 | Ht 69.0 in | Wt 269.9 lb

## 2022-10-04 DIAGNOSIS — M545 Low back pain, unspecified: Secondary | ICD-10-CM

## 2022-10-04 DIAGNOSIS — G8929 Other chronic pain: Secondary | ICD-10-CM | POA: Diagnosis not present

## 2022-10-04 DIAGNOSIS — R600 Localized edema: Secondary | ICD-10-CM | POA: Diagnosis not present

## 2022-10-04 DIAGNOSIS — E1159 Type 2 diabetes mellitus with other circulatory complications: Secondary | ICD-10-CM | POA: Diagnosis not present

## 2022-10-04 DIAGNOSIS — I10 Essential (primary) hypertension: Secondary | ICD-10-CM

## 2022-10-04 LAB — POCT GLYCOSYLATED HEMOGLOBIN (HGB A1C): Hemoglobin A1C: 6.4 % — AB (ref 4.0–5.6)

## 2022-10-04 MED ORDER — CYCLOBENZAPRINE HCL 10 MG PO TABS
10.0000 mg | ORAL_TABLET | Freq: Three times a day (TID) | ORAL | 0 refills | Status: DC | PRN
Start: 2022-10-04 — End: 2022-11-08

## 2022-10-04 MED ORDER — RYBELSUS 3 MG PO TABS
3.0000 mg | ORAL_TABLET | Freq: Every day | ORAL | 0 refills | Status: DC
Start: 2022-10-04 — End: 2022-11-08

## 2022-10-04 MED ORDER — TRIAMCINOLONE ACETONIDE 0.1 % EX CREA: 1.0000 | TOPICAL_CREAM | Freq: Two times a day (BID) | CUTANEOUS | 0 refills | Status: DC

## 2022-10-04 NOTE — Progress Notes (Signed)
[facility] [address] [place], [place] [address]  Internal MEDICINE  Office Visit Note  Patient Name: Kelsey Sampson  [id number]  [id number]  Date of Service: 10/12/2022  Chief Complaint  Patient presents with   Follow-up   Diabetes   Hypertension    HPI Pt is here for routine follow up -Did have a fall in a parking lot after last visit -Legs swollen, goes down at night. Will look into compression stocking -Did have to drop to 5 hour part time job due to pain -back still hurts and is going to follow up with ortho -referred to pain clinic by rheumatology for severe fibromyalgia  -itchy rash on left ankle--will send topical -Taking potassium OTC, will recheck potassium -tolerating metformin  Current Medication: Outpatient Encounter Medications as of 10/04/2022  Medication Sig   Ascorbic Acid (VITAMIN C PO) Take 1 tablet by mouth daily.   DULoxetine (CYMBALTA) 20 MG capsule Take 1 capsule (20 mg total) by mouth daily.   hydrochlorothiazide (HYDRODIURIL) 25 MG tablet TAKE 1 TABLET (25 MG TOTAL) BY MOUTH DAILY.   ibuprofen (ADVIL) 200 MG tablet Take 600 mg by mouth every 6 (six) hours as needed for moderate pain or headache.   metFORMIN (GLUCOPHAGE) 500 MG tablet Take 1 tablet (500 mg total) by mouth daily with breakfast.   Multiple Vitamin (MULTIVITAMIN WITH MINERALS) TABS tablet Take 1 tablet by mouth daily.   Na Sulfate-K Sulfate-Mg Sulf 17.5-3.13-1.6 GM/177ML SOLN Take by mouth as directed.   nystatin-triamcinolone ointment (MYCOLOG) Apply 1 Application topically 2 (two) times daily.   rosuvastatin (CRESTOR) 5 MG tablet TAKE 1 TABLET (5 MG TOTAL) BY MOUTH DAILY.   Semaglutide (RYBELSUS) 3 MG TABS Take 1 tablet (3 mg total) by mouth daily.   traZODone (DESYREL) 50 MG tablet TAKE 1 TABLET BY MOUTH EVERYDAY AT BEDTIME   triamcinolone cream (KENALOG) 0.1 % Apply 1 Application topically 2 (two) times daily.   [DISCONTINUED] cyclobenzaprine (FLEXERIL) 10 MG tablet  Take 1 tablet (10 mg total) by mouth 3 (three) times daily as needed for muscle spasms.   cyclobenzaprine (FLEXERIL) 10 MG tablet Take 1 tablet (10 mg total) by mouth 3 (three) times daily as needed for muscle spasms.   No facility-administered encounter medications on file as of 10/04/2022.    Surgical History: Past Surgical History:  Procedure Laterality Date   ABDOMINAL HYSTERECTOMY     COLONOSCOPY WITH PROPOFOL N/A 10/15/2021   Procedure: COLONOSCOPY WITH PROPOFOL;  Surgeon: Wyline Mood, MD;  Location: [facility] ENDOSCOPY;  Service: Gastroenterology;  Laterality: N/A;   cyst removed Left    52 years old cyst removed    Medical History: Past Medical History:  Diagnosis Date   Abscess of buttock, left    Diabetes mellitus without complication (HCC)    Hyperlipidemia    Hypertension    Tobacco use disorder 03/26/2021    Family History: Family History  Problem Relation Age of Onset   Hypertension Mother     Social History   Socioeconomic History   Marital status: Single    Spouse name: Not on file   Number of children: Not on file   Years of education: Not on file   Highest education level: Not on file  Occupational History   Not on file  Tobacco Use   Smoking status: Every Day    Current packs/day: 0.00    Types: Cigarettes    Last attempt to quit: 11/30/2020    Years since quitting: 1.8  Smokeless tobacco: Never   Tobacco comments:    1 pack every 2 days.  Vaping Use   Vaping status: Some Days   Substances: Flavoring  Substance and Sexual Activity   Alcohol use: No   Drug use: Not Currently    Types: Marijuana    Comment: over 30 days ago   Sexual activity: Not Currently    Partners: Male  Other Topics Concern   Not on file  Social History Narrative   Not on file   Social Determinants of Health   Financial Resource Strain: Not on file  Food Insecurity: Not on file  Transportation Needs: Not on file  Physical Activity: Not on file  Stress: Not on file   Social Connections: Not on file  Intimate Partner Violence: Not At Risk (04/02/2021)   Humiliation, Afraid, Rape, and Kick questionnaire    Fear of Current or Ex-Partner: No    Emotionally Abused: No    Physically Abused: No    Sexually Abused: No      Review of Systems  Constitutional:  Negative for chills, fatigue and unexpected weight change.  HENT:  Negative for congestion, postnasal drip, rhinorrhea, sneezing and sore throat.   Eyes:  Negative for redness.  Respiratory:  Negative for cough, chest tightness and shortness of breath.   Cardiovascular:  Positive for leg swelling. Negative for chest pain and palpitations.  Gastrointestinal:  Negative for abdominal pain, constipation, diarrhea, nausea and vomiting.  Genitourinary:  Negative for dysuria and frequency.  Musculoskeletal:  Positive for arthralgias and back pain. Negative for joint swelling and neck pain.  Skin:  Negative for rash.  Neurological: Negative.  Negative for tremors and numbness.  Hematological:  Negative for adenopathy. Does not bruise/bleed easily.  Psychiatric/Behavioral:  Positive for sleep disturbance. Negative for behavioral problems (Depression) and suicidal ideas. The patient is not nervous/anxious.     Vital Signs: BP 110/80   Pulse 91   Temp 98.3 F (36.8 C)   Resp 16   Ht 5\' 9"  (1.753 m)   Wt 269 lb 14.4 oz (122.4 kg)   SpO2 96%   BMI 39.86 kg/m    Physical Exam Vitals and nursing note reviewed.  Constitutional:      General: She is not in acute distress.    Appearance: Normal appearance. She is well-developed. She is obese. She is not diaphoretic.  HENT:     Head: Normocephalic and atraumatic.     Mouth/Throat:     Pharynx: No oropharyngeal exudate.  Eyes:     Pupils: Pupils are equal, round, and reactive to light.  Neck:     Thyroid: No thyromegaly.     Vascular: No JVD.     Trachea: No tracheal deviation.  Cardiovascular:     Rate and Rhythm: Normal rate and regular rhythm.      Heart sounds: Normal heart sounds. No murmur heard.    No friction rub. No gallop.  Pulmonary:     Effort: Pulmonary effort is normal. No respiratory distress.     Breath sounds: No wheezing or rales.  Chest:     Chest wall: No tenderness.  Abdominal:     General: Bowel sounds are normal.     Palpations: Abdomen is soft.  Musculoskeletal:        General: Normal range of motion.     Cervical back: Normal range of motion and neck supple.  Lymphadenopathy:     Cervical: No cervical adenopathy.  Skin:    General:  Skin is warm and dry.  Neurological:     Mental Status: She is alert and oriented to person, place, and time.     Cranial Nerves: No cranial nerve deficit.  Psychiatric:        Behavior: Behavior normal.        Thought Content: Thought content normal.        Judgment: Judgment normal.        Assessment/Plan: 1. Essential hypertension Stable, continue current medication  2. Type 2 diabetes mellitus with other circulatory complication, without long-term current use of insulin (HCC) - POCT HgB A1C is 6.4 which is slightly improved from 6.5, continue metformin - Semaglutide (RYBELSUS) 3 MG TABS; Take 1 tablet (3 mg total) by mouth daily.  Dispense: 30 tablet; Refill: 0  3. Chronic bilateral low back pain without sciatica Will send flexeril and pt will see ortho and pain management - cyclobenzaprine (FLEXERIL) 10 MG tablet; Take 1 tablet (10 mg total) by mouth 3 (three) times daily as needed for muscle spasms.  Dispense: 30 tablet; Refill: 0  4. Bilateral lower extremity edema Slightly swelling in office today, fluctuates throughout day, will try compression stockings and elevate as able and continue hydrochlorothiazide. Continue to monitor   General Counseling: Kaleeyah verbalizes understanding of the findings of todays visit and agrees with plan of treatment. I have discussed any further diagnostic evaluation that may be needed or ordered today. We also reviewed her  medications today. she has been encouraged to call the office with any questions or concerns that should arise related to todays visit.    Orders Placed This Encounter  Procedures   POCT HgB A1C    Meds ordered this encounter  Medications   triamcinolone cream (KENALOG) 0.1 %    Sig: Apply 1 Application topically 2 (two) times daily.    Dispense:  30 g    Refill:  0   cyclobenzaprine (FLEXERIL) 10 MG tablet    Sig: Take 1 tablet (10 mg total) by mouth 3 (three) times daily as needed for muscle spasms.    Dispense:  30 tablet    Refill:  0   Semaglutide (RYBELSUS) 3 MG TABS    Sig: Take 1 tablet (3 mg total) by mouth daily.    Dispense:  30 tablet    Refill:  0    This patient was seen by Lynn Ito, PA-C in collaboration with Dr. Beverely Risen as a part of collaborative care agreement.   Total time spent:30 Minutes Time spent includes review of chart, medications, test results, and follow up plan with the patient.      Dr Lyndon Code Internal medicine

## 2022-10-07 DIAGNOSIS — E876 Hypokalemia: Secondary | ICD-10-CM | POA: Diagnosis not present

## 2022-10-07 DIAGNOSIS — M5416 Radiculopathy, lumbar region: Secondary | ICD-10-CM | POA: Diagnosis not present

## 2022-10-07 DIAGNOSIS — M5440 Lumbago with sciatica, unspecified side: Secondary | ICD-10-CM | POA: Diagnosis not present

## 2022-10-07 DIAGNOSIS — E1169 Type 2 diabetes mellitus with other specified complication: Secondary | ICD-10-CM | POA: Diagnosis not present

## 2022-10-07 DIAGNOSIS — Z6838 Body mass index (BMI) 38.0-38.9, adult: Secondary | ICD-10-CM | POA: Diagnosis not present

## 2022-11-08 ENCOUNTER — Telehealth: Payer: Self-pay

## 2022-11-08 ENCOUNTER — Ambulatory Visit (INDEPENDENT_AMBULATORY_CARE_PROVIDER_SITE_OTHER): Payer: Medicaid Other | Admitting: Physician Assistant

## 2022-11-08 ENCOUNTER — Encounter: Payer: Self-pay | Admitting: Physician Assistant

## 2022-11-08 VITALS — BP 123/85 | HR 90 | Temp 98.5°F | Resp 16 | Ht 69.0 in | Wt 264.8 lb

## 2022-11-08 DIAGNOSIS — Z0001 Encounter for general adult medical examination with abnormal findings: Secondary | ICD-10-CM | POA: Diagnosis not present

## 2022-11-08 DIAGNOSIS — M545 Low back pain, unspecified: Secondary | ICD-10-CM | POA: Diagnosis not present

## 2022-11-08 DIAGNOSIS — E1159 Type 2 diabetes mellitus with other circulatory complications: Secondary | ICD-10-CM | POA: Diagnosis not present

## 2022-11-08 DIAGNOSIS — G894 Chronic pain syndrome: Secondary | ICD-10-CM | POA: Insufficient documentation

## 2022-11-08 DIAGNOSIS — Z789 Other specified health status: Secondary | ICD-10-CM | POA: Insufficient documentation

## 2022-11-08 DIAGNOSIS — R300 Dysuria: Secondary | ICD-10-CM

## 2022-11-08 DIAGNOSIS — Z1231 Encounter for screening mammogram for malignant neoplasm of breast: Secondary | ICD-10-CM

## 2022-11-08 DIAGNOSIS — G8929 Other chronic pain: Secondary | ICD-10-CM

## 2022-11-08 DIAGNOSIS — I10 Essential (primary) hypertension: Secondary | ICD-10-CM | POA: Diagnosis not present

## 2022-11-08 DIAGNOSIS — M899 Disorder of bone, unspecified: Secondary | ICD-10-CM | POA: Insufficient documentation

## 2022-11-08 DIAGNOSIS — Z79899 Other long term (current) drug therapy: Secondary | ICD-10-CM | POA: Insufficient documentation

## 2022-11-08 DIAGNOSIS — R928 Other abnormal and inconclusive findings on diagnostic imaging of breast: Secondary | ICD-10-CM

## 2022-11-08 MED ORDER — RYBELSUS 7 MG PO TABS
7.0000 mg | ORAL_TABLET | Freq: Every day | ORAL | 0 refills | Status: DC
Start: 2022-11-08 — End: 2023-02-07

## 2022-11-08 MED ORDER — CYCLOBENZAPRINE HCL 10 MG PO TABS
10.0000 mg | ORAL_TABLET | Freq: Three times a day (TID) | ORAL | 0 refills | Status: DC | PRN
Start: 2022-11-08 — End: 2022-12-13

## 2022-11-08 NOTE — Progress Notes (Unsigned)
[facility] [address] [place], [place] [address]  Internal MEDICINE  Office Visit Note  Patient Name: Kelsey Sampson  [id number]  [id number]  Date of Service: 11/09/2022  Chief Complaint  Patient presents with   Annual Exam   Diabetes   Hypertension   Hyperlipidemia     HPI Pt is here for routine health maintenance examination -never got rybelsus, will request PA now. Given 3mg  sample and will send 7mg  script -sees the pain clinic on Wednesday--was referred by rheumatology for fibromyalgia management -ortho evaluated her back pain and was given prednisone, tramadol, and had been using flexeril. Ran out of flexeril so taking tramadol now. Never took prednisone because was confused by taper and may take this now. Will send flexeril refill to use prn -Eye exam needed -still has hernia, states they wont do surgery until she loses more weight -smoking 1 cig every now and then, not daily (20 years of 1/2 ppd) -overdue for mammogram follow up -UTD on pap and colon screening  Current Medication: Outpatient Encounter Medications as of 11/08/2022  Medication Sig   Ascorbic Acid (VITAMIN C PO) Take 1 tablet by mouth daily.   DULoxetine (CYMBALTA) 20 MG capsule Take 1 capsule (20 mg total) by mouth daily.   hydrochlorothiazide (HYDRODIURIL) 25 MG tablet TAKE 1 TABLET (25 MG TOTAL) BY MOUTH DAILY.   ibuprofen (ADVIL) 200 MG tablet Take 600 mg by mouth every 6 (six) hours as needed for moderate pain or headache.   meloxicam (MOBIC) 15 MG tablet Take 15 mg by mouth 3 (three) times daily.   metFORMIN (GLUCOPHAGE) 500 MG tablet Take 1 tablet (500 mg total) by mouth daily with breakfast.   Multiple Vitamin (MULTIVITAMIN WITH MINERALS) TABS tablet Take 1 tablet by mouth daily.   Na Sulfate-K Sulfate-Mg Sulf 17.5-3.13-1.6 GM/177ML SOLN Take by mouth as directed.   nystatin-triamcinolone ointment (MYCOLOG) Apply 1 Application topically 2 (two) times daily.   predniSONE  (DELTASONE) 10 MG tablet 5,5,4,4,3,3,2,2,1,1 tab po tapering dose   rosuvastatin (CRESTOR) 5 MG tablet TAKE 1 TABLET (5 MG TOTAL) BY MOUTH DAILY.   Semaglutide (RYBELSUS) 7 MG TABS Take 1 tablet (7 mg total) by mouth daily.   traMADol (ULTRAM) 50 MG tablet Take by mouth.   traZODone (DESYREL) 50 MG tablet TAKE 1 TABLET BY MOUTH EVERYDAY AT BEDTIME   traZODone (DESYREL) 50 MG tablet Take 1 tablet by mouth at bedtime.   triamcinolone cream (KENALOG) 0.1 % Apply 1 Application topically 2 (two) times daily.   [DISCONTINUED] cyclobenzaprine (FLEXERIL) 10 MG tablet Take 1 tablet (10 mg total) by mouth 3 (three) times daily as needed for muscle spasms.   [DISCONTINUED] methocarbamol (ROBAXIN) 500 MG tablet Take by mouth.   [DISCONTINUED] Semaglutide (RYBELSUS) 3 MG TABS Take 1 tablet (3 mg total) by mouth daily.   cyclobenzaprine (FLEXERIL) 10 MG tablet Take 1 tablet (10 mg total) by mouth 3 (three) times daily as needed for muscle spasms.   No facility-administered encounter medications on file as of 11/08/2022.    Surgical History: Past Surgical History:  Procedure Laterality Date   ABDOMINAL HYSTERECTOMY     COLONOSCOPY WITH PROPOFOL N/A 10/15/2021   Procedure: COLONOSCOPY WITH PROPOFOL;  Surgeon: Wyline Mood, MD;  Location: [facility] ENDOSCOPY;  Service: Gastroenterology;  Laterality: N/A;   cyst removed Left    52 years old cyst removed    Medical History: Past Medical History:  Diagnosis Date   Abscess of buttock, left    Diabetes mellitus  without complication (HCC)    Hyperlipidemia    Hypertension    Tobacco use disorder 03/26/2021    Family History: Family History  Problem Relation Age of Onset   Hypertension Mother       Review of Systems  Constitutional:  Negative for chills, fatigue and unexpected weight change.  HENT:  Negative for congestion, postnasal drip, rhinorrhea, sneezing and sore throat.   Eyes:  Negative for redness.  Respiratory:  Negative for cough, chest  tightness and shortness of breath.   Cardiovascular:  Negative for chest pain and palpitations.  Gastrointestinal:  Negative for abdominal pain, constipation, diarrhea, nausea and vomiting.  Genitourinary:  Negative for dysuria and frequency.  Musculoskeletal:  Positive for arthralgias and back pain. Negative for joint swelling and neck pain.  Skin:  Negative for rash.  Neurological: Negative.  Negative for tremors and numbness.  Hematological:  Negative for adenopathy. Does not bruise/bleed easily.  Psychiatric/Behavioral:  Negative for behavioral problems (Depression) and suicidal ideas. The patient is not nervous/anxious.      Vital Signs: BP 123/85   Pulse 90 Comment: 105  Temp 98.5 F (36.9 C)   Resp 16   Ht 5\' 9"  (1.753 m)   Wt 264 lb 12.8 oz (120.1 kg)   SpO2 93%   BMI 39.10 kg/m    Physical Exam Vitals and nursing note reviewed.  Constitutional:      General: She is not in acute distress.    Appearance: Normal appearance. She is well-developed. She is obese. She is not diaphoretic.  HENT:     Head: Normocephalic and atraumatic.     Mouth/Throat:     Pharynx: No oropharyngeal exudate.  Eyes:     Pupils: Pupils are equal, round, and reactive to light.  Neck:     Thyroid: No thyromegaly.     Vascular: No JVD.     Trachea: No tracheal deviation.  Cardiovascular:     Rate and Rhythm: Normal rate and regular rhythm.     Heart sounds: Normal heart sounds. No murmur heard.    No friction rub. No gallop.  Pulmonary:     Effort: Pulmonary effort is normal. No respiratory distress.     Breath sounds: No wheezing or rales.  Chest:     Chest wall: No tenderness.  Abdominal:     General: Bowel sounds are normal.     Palpations: Abdomen is soft.     Tenderness: There is no abdominal tenderness.     Hernia: A hernia is present.  Musculoskeletal:        General: Normal range of motion.     Cervical back: Normal range of motion and neck supple.  Lymphadenopathy:      Cervical: No cervical adenopathy.  Skin:    General: Skin is warm and dry.  Neurological:     Mental Status: She is alert and oriented to person, place, and time.     Cranial Nerves: No cranial nerve deficit.  Psychiatric:        Behavior: Behavior normal.        Thought Content: Thought content normal.        Judgment: Judgment normal.      LABS: Recent Results (from the past 2160 hour(s))  Urinalysis, Routine w reflex microscopic -Urine, Clean Catch     Status: Abnormal   Collection Time: 08/27/22  6:26 PM  Result Value Ref Range   Color, Urine YELLOW (A) YELLOW   APPearance CLEAR (A) CLEAR  Specific Gravity, Urine 1.034 (H) 1.005 - 1.030   pH 6.0 5.0 - 8.0   Glucose, UA NEGATIVE NEGATIVE mg/dL   Hgb urine dipstick NEGATIVE NEGATIVE   Bilirubin Urine NEGATIVE NEGATIVE   Ketones, ur 5 (A) NEGATIVE mg/dL   Protein, ur 30 (A) NEGATIVE mg/dL   Nitrite NEGATIVE NEGATIVE   Leukocytes,Ua NEGATIVE NEGATIVE   RBC / HPF 0-5 0 - 5 RBC/hpf   WBC, UA 0-5 0 - 5 WBC/hpf   Bacteria, UA RARE (A) NONE SEEN   Squamous Epithelial / HPF 0-5 0 - 5 /HPF   Mucus PRESENT     Comment: Performed at [facility], [address] [place], [place] [address]  POCT HgB A1C     Status: Abnormal   Collection Time: 10/04/22  1:59 PM  Result Value Ref Range   Hemoglobin A1C 6.4 (A) 4.0 - 5.6 %   HbA1c POC (<> result, manual entry)     HbA1c, POC (prediabetic range)     HbA1c, POC (controlled diabetic range)    Potassium     Status: None   Collection Time: 10/07/22  8:37 AM  Result Value Ref Range   Potassium 3.9 3.5 - 5.2 mmol/L  UA/M w/rflx Culture, Routine     Status: Abnormal   Collection Time: 11/08/22  3:41 PM   Specimen: Urine   Urine  Result Value Ref Range   Specific Gravity, UA      >=1.030 (A) 1.005 - 1.030   pH, UA 5.0 5.0 - 7.5   Color, UA Yellow Yellow   Appearance Ur Clear Clear   Leukocytes,UA Negative Negative   Protein,UA Negative Negative/Trace   Glucose, UA  Negative Negative   Ketones, UA Trace (A) Negative   RBC, UA Negative Negative   Bilirubin, UA Negative Negative   Urobilinogen, Ur 0.2 0.2 - 1.0 mg/dL   Nitrite, UA Negative Negative   Microscopic Examination Comment     Comment: Microscopic follows if indicated.   Microscopic Examination See below:     Comment: Microscopic was indicated and was performed.   Urinalysis Reflex Comment     Comment: This specimen will not reflex to a Urine Culture.  Microscopic Examination     Status: None   Collection Time: 11/08/22  3:41 PM   Urine  Result Value Ref Range   WBC, UA 0-5 0 - 5 /hpf   RBC, Urine 0-2 0 - 2 /hpf   Epithelial Cells (non renal) 0-10 0 - 10 /hpf   Casts None seen None seen /lpf   Bacteria, UA None seen None seen/Few        Assessment/Plan: 1. Encounter for general adult medical examination with abnormal findings CPE performed, UTD on pap and colon screenings, due for mammogram/US follow up  2. Essential hypertension Stable, continue current medication  3. Type 2 diabetes mellitus with other circulatory complication, without long-term current use of insulin (HCC) Will continue metformin and add rybelsus. Given 3mg  sample with instructions on how to take this. Will work on PA for 7mg  dose once 3mg  completed. - Semaglutide (RYBELSUS) 7 MG TABS; Take 1 tablet (7 mg total) by mouth daily.  Dispense: 30 tablet; Refill: 0  4. Chronic bilateral low back pain without sciatica May use flexeril as needed. Will follow up with ortho as needed and will also be seeing pain management for fibromyalgia on Wed - cyclobenzaprine (FLEXERIL) 10 MG tablet; Take 1 tablet (10 mg total) by mouth 3 (three) times daily as  needed for muscle spasms.  Dispense: 30 tablet; Refill: 0  5. Abnormal mammogram of right breast - MM 3D DIAGNOSTIC MAMMOGRAM BILATERAL BREAST; Future - [place] LIMITED ULTRASOUND INCLUDING AXILLA RIGHT BREAST; Future  6. Dysuria - UA/M w/rflx Culture, Routine - Microscopic  Examination   General Counseling: Kendal verbalizes understanding of the findings of todays visit and agrees with plan of treatment. I have discussed any further diagnostic evaluation that may be needed or ordered today. We also reviewed her medications today. she has been encouraged to call the office with any questions or concerns that should arise related to todays visit.    Counseling:    Orders Placed This Encounter  Procedures   Microscopic Examination   MM 3D DIAGNOSTIC MAMMOGRAM BILATERAL BREAST   US LIMITED ULTRASOUND INCLUDING AXILLA RIGHT BREAST   UA/M w/rflx Culture, Routine    Meds ordered this encounter  Medications   cyclobenzaprine (FLEXERIL) 10 MG tablet    Sig: Take 1 tablet (10 mg total) by mouth 3 (three) times daily as needed for muscle spasms.    Dispense:  30 tablet    Refill:  0   Semaglutide (RYBELSUS) 7 MG TABS    Sig: Take 1 tablet (7 mg total) by mouth daily.    Dispense:  30 tablet    Refill:  0    This patient was seen by Lynn Ito, PA-C in collaboration with Dr. Beverely Risen as a part of collaborative care agreement.  Total time spent:35 Minutes  Time spent includes review of chart, medications, test results, and follow up plan with the patient.     Lyndon Code, MD  Internal Medicine

## 2022-11-08 NOTE — Telephone Encounter (Signed)
Completed P.A. for patient's Rybelsus 7mg .

## 2022-11-08 NOTE — Patient Instructions (Signed)
____________________________________________________________________________________________  New Patients  Welcome to [facility].   Initial Visit The first or initial visit consists of an evaluation only.   Interventional pain management.  We offer therapies other than opioid controlled substances to manage chronic pain. These include, but are not limited to, diagnostic, therapeutic, and palliative specialized injection therapies (i.e.: Epidural Steroids, Facet Blocks, etc.). We specialize in a variety of nerve blocks as well as radiofrequency treatments. We offer pain implant evaluations and trials, as well as follow up management. In addition we also provide a variety joint injections, including Viscosupplementation (AKA: Gel Therapy).  Prescription Pain Medication. We specialize in alternatives to opioids. We can provide evaluations and recommendations for/of pharmacologic therapies based on CDC Guidelines.  We no longer take patients for long-term medication management. We will not be taking over your pain medications.  ____________________________________________________________________________________________    ____________________________________________________________________________________________  Patient Information update  To: All of our patients.  Re: Name change.  It has been made official that our current name, "[facility]"   will soon be changed to "[facility]".   The purpose of this change is to eliminate any confusion created by the concept of our practice being a "Medication Management Pain Clinic". In the past this has led to the misconception that we treat pain primarily by the use of prescription medications.  Nothing can be farther from the truth.   Understanding PAIN MANAGEMENT: To  further understand what our practice does, you first have to understand that "Pain Management" is a subspecialty that requires additional training once a physician has completed their specialty training, which can be in either Anesthesia, Neurology, Psychiatry, or Physical Medicine and Rehabilitation (PMR). Each one of these contributes to the final approach taken by each physician to the management of their patient's pain. To be a "Pain Management Specialist" you must have first completed one of the specialty trainings below.  Anesthesiologists - trained in clinical pharmacology and interventional techniques such as nerve blockade and regional as well as central neuroanatomy. They are trained to block pain before, during, and after surgical interventions.  Neurologists - trained in the diagnosis and pharmacological treatment of complex neurological conditions, such as Multiple Sclerosis, Parkinson's, spinal cord injuries, and other systemic conditions that may be associated with symptoms that may include but are not limited to pain. They tend to rely primarily on the treatment of chronic pain using prescription medications.  Psychiatrist - trained in conditions affecting the psychosocial wellbeing of patients including but not limited to depression, anxiety, schizophrenia, personality disorders, addiction, and other substance use disorders that may be associated with chronic pain. They tend to rely primarily on the treatment of chronic pain using prescription medications.   Physical Medicine and Rehabilitation (PMR) physicians, also known as physiatrists - trained to treat a wide variety of medical conditions affecting the brain, spinal cord, nerves, bones, joints, ligaments, muscles, and tendons. Their training is primarily aimed at treating patients that have suffered injuries that have caused severe physical impairment. Their training is primarily aimed at the physical therapy and rehabilitation of those  patients. They may also work alongside orthopedic surgeons or neurosurgeons using their expertise in assisting surgical patients to recover after their surgeries.  INTERVENTIONAL PAIN MANAGEMENT is sub-subspecialty of Pain Management.  Our physicians are Board-certified in Anesthesia, Pain Management, and Interventional Pain Management.  This meaning that not only have they been trained  and Board-certified in their specialty of Anesthesia, and subspecialty of Pain Management, but they have also received further training in the sub-subspecialty of Interventional Pain Management, in order to become Board-certified as INTERVENTIONAL PAIN MANAGEMENT SPECIALIST.    Mission: Our goal is to use our skills in  INTERVENTIONAL PAIN MANAGEMENT as alternatives to the chronic use of prescription opioid medications for the treatment of pain. To make this more clear, we have changed our name to reflect what we do and offer. We will continue to offer medication management assessment and recommendations, but we will not be taking over any patient's medication management.  ____________________________________________________________________________________________

## 2022-11-08 NOTE — Progress Notes (Unsigned)
Patient: Kelsey Sampson  Service Category: E/M  Provider: Oswaldo Done, MD  DOB: 19-May-1970  DOS: 11/10/2022  Referring Provider: Defoor, Allen Derry  MRN: [id number]  Setting: Ambulatory outpatient  PCP: Carlean Jews, PA-C  Type: New Patient  Specialty: Interventional Pain Management    Location: Office  Delivery: Face-to-face     Primary Reason(s) for Visit: Encounter for initial evaluation of one or more chronic problems (new to examiner) potentially causing chronic pain, and posing a threat to normal musculoskeletal function. (Level of risk: High) CC: No chief complaint on file.  HPI  Kelsey Sampson is a 52 y.o. year old, female patient, who comes for the first time to our practice referred by Defoor, Dionne Ano, PA-C for our initial evaluation of Kelsey Sampson chronic pain. She has Morbid obesity (HCC); Incarcerated ventral hernia; Tobacco use disorder; Fibromyalgia; Chronic pain syndrome; Pharmacologic therapy; Disorder of skeletal system; and Problems influencing health status on their problem list. Today she comes in for evaluation of Kelsey Sampson No chief complaint on file.  Pain Assessment: Location:     Radiating:   Onset:   Duration:   Quality:   Severity:  /10 (subjective, self-reported pain score)  Effect on ADL:   Timing:   Modifying factors:   BP:    HR:    Onset and Duration: {Hx; Onset and Duration:210120511} Cause of pain: {Hx; Cause:210120521} Severity: {Pain Severity:210120502} Timing: {Symptoms; Timing:210120501} Aggravating Factors: {Causes; Aggravating pain factors:210120507} Alleviating Factors: {Causes; Alleviating Factors:210120500} Associated Problems: {Hx; Associated problems:210120515} Quality of Pain: {Hx; Symptom quality or Descriptor:210120531} Previous Examinations or Tests: {Hx; Previous examinations or test:210120529} Previous Treatments: {Hx; Previous Treatment:210120503}  Kelsey Sampson is being evaluated for possible interventional pain management  therapies for the treatment of Kelsey Sampson chronic pain.   ***  Kelsey Sampson has been informed that this initial visit was an evaluation only.  On the follow up appointment I will go over the results, including ordered tests and available interventional therapies. At that time she will have the opportunity to decide whether to proceed with offered therapies or not. In the event that Kelsey Sampson prefers avoiding interventional options, this will conclude our involvement in the case.  Medication management recommendations may be provided upon request.  Patient informed that diagnostic tests may be ordered to assist in identifying underlying causes, narrow the list of differential diagnoses and aid in determining candidacy for (or contraindications to) planned therapeutic interventions.  Historic Controlled Substance Pharmacotherapy Review  PMP and historical list of controlled substances: Tramadol 50 mg tablet, 1 tab p.o. 4 times daily (# 30) (last filled on 10/07/2022); oxycodone IR 5 mg tablet, 1 tab p.o. 4 times daily (# 20) (last filled on 03/21/2021) Most recently prescribed opioid analgesics:   Tramadol 50 mg tablet, 1 tab p.o. 4 times daily (# 30) (last filled on 10/07/2022) MME/day: 42.86 mg/day  Historical Monitoring: The patient  reports that she does not currently use drugs after having used the following drugs: Marijuana. List of prior UDS Testing: No results found for: "MDMA", "COCAINSCRNUR", "PCPSCRNUR", "PCPQUANT", "CANNABQUANT", "THCU", "ETH", "CBDTHCR", "D8THCCBX", "D9THCCBX" Historical Background Evaluation: [place] PMP: PDMP reviewed during this encounter. Review of the past 44-months conducted.             PMP NARX Score Report:  Narcotic: 210 Sedative: 090 Stimulant: 000  Department of public safety, offender search: Engineer, mining Information) Non-contributory Risk Assessment Profile: Aberrant behavior: None observed or detected today Risk factors for fatal opioid overdose: None identified  today PMP  NARX Overdose Risk Score: 310 Fatal overdose hazard ratio (HR): Calculation deferred Non-fatal overdose hazard ratio (HR): Calculation deferred Risk of opioid abuse or dependence: 0.7-3.0% with doses <= 36 MME/day and 6.1-26% with doses >= 120 MME/day. Substance use disorder (SUD) risk level: See below Personal History of Substance Abuse (SUD-Substance use disorder):  Alcohol:    Illegal Drugs:    Rx Drugs:    ORT Risk Level calculation:    ORT Scoring interpretation table:  Score <3 = Low Risk for SUD  Score between 4-7 = Moderate Risk for SUD  Score >8 = High Risk for Opioid Abuse   PHQ-2 Depression Scale:  Total score:    PHQ-2 Scoring interpretation table: (Score and probability of major depressive disorder)  Score 0 = No depression  Score 1 = 15.4% Probability  Score 2 = 21.1% Probability  Score 3 = 38.4% Probability  Score 4 = 45.5% Probability  Score 5 = 56.4% Probability  Score 6 = 78.6% Probability   PHQ-9 Depression Scale:  Total score:    PHQ-9 Scoring interpretation table:  Score 0-4 = No depression  Score 5-9 = Mild depression  Score 10-14 = Moderate depression  Score 15-19 = Moderately severe depression  Score 20-27 = Severe depression (2.4 times higher risk of SUD and 2.89 times higher risk of overuse)   Pharmacologic Plan: As per protocol, I have not taken over any controlled substance management, pending the results of ordered tests and/or consults.            Initial impression: Pending review of available data and ordered tests.  Meds   Current Outpatient Medications:    methocarbamol (ROBAXIN) 500 MG tablet, Take by mouth., Disp: , Rfl:    predniSONE (DELTASONE) 10 MG tablet, 5,5,4,4,3,3,2,2,1,1 tab po tapering dose, Disp: , Rfl:    traMADol (ULTRAM) 50 MG tablet, Take by mouth., Disp: , Rfl:    traZODone (DESYREL) 50 MG tablet, Take 1 tablet by mouth at bedtime., Disp: , Rfl:    Ascorbic Acid (VITAMIN C PO), Take 1 tablet by mouth daily.,  Disp: , Rfl:    cyclobenzaprine (FLEXERIL) 10 MG tablet, Take 1 tablet (10 mg total) by mouth 3 (three) times daily as needed for muscle spasms., Disp: 30 tablet, Rfl: 0   DULoxetine (CYMBALTA) 20 MG capsule, Take 1 capsule (20 mg total) by mouth daily., Disp: 30 capsule, Rfl: 3   hydrochlorothiazide (HYDRODIURIL) 25 MG tablet, TAKE 1 TABLET (25 MG TOTAL) BY MOUTH DAILY., Disp: 90 tablet, Rfl: 3   ibuprofen (ADVIL) 200 MG tablet, Take 600 mg by mouth every 6 (six) hours as needed for moderate pain or headache., Disp: , Rfl:    meloxicam (MOBIC) 15 MG tablet, Take 15 mg by mouth 3 (three) times daily., Disp: , Rfl:    metFORMIN (GLUCOPHAGE) 500 MG tablet, Take 1 tablet (500 mg total) by mouth daily with breakfast., Disp: 60 tablet, Rfl: 1   Multiple Vitamin (MULTIVITAMIN WITH MINERALS) TABS tablet, Take 1 tablet by mouth daily., Disp: , Rfl:    Na Sulfate-K Sulfate-Mg Sulf 17.5-3.13-1.6 GM/177ML SOLN, Take by mouth as directed., Disp: , Rfl:    nystatin-triamcinolone ointment (MYCOLOG), Apply 1 Application topically 2 (two) times daily., Disp: 30 g, Rfl: 0   rosuvastatin (CRESTOR) 5 MG tablet, TAKE 1 TABLET (5 MG TOTAL) BY MOUTH DAILY., Disp: 90 tablet, Rfl: 1   Semaglutide (RYBELSUS) 3 MG TABS, Take 1 tablet (3 mg total) by mouth daily., Disp: 30 tablet, Rfl: 0  traZODone (DESYREL) 50 MG tablet, TAKE 1 TABLET BY MOUTH EVERYDAY AT BEDTIME, Disp: 90 tablet, Rfl: 1   triamcinolone cream (KENALOG) 0.1 %, Apply 1 Application topically 2 (two) times daily., Disp: 30 g, Rfl: 0  Imaging Review  Knee Imaging: Knee-L DG 4 views: Results for orders placed during the hospital encounter of 03/27/18 DG Knee Complete 4 Views Left  Narrative CLINICAL DATA:  52 year old female with 5 days of left knee pain, medial pain. No known injury.  EXAM: LEFT KNEE - COMPLETE 4+ VIEW  COMPARISON:  None.  FINDINGS: Preserved joint spaces and alignment. Possible small suprapatellar joint effusion. Patella intact.  Normal bone mineralization and no osseous abnormality identified.  IMPRESSION: Possible small joint effusion but otherwise negative radiographic appearance of the left knee.   Electronically Signed By: Odessa Fleming M.D. On: 03/26/2018 20:48  Complexity Note: Imaging results reviewed.                         ROS  Cardiovascular: {Hx; Cardiovascular History:210120525} Pulmonary or Respiratory: {Hx; Pumonary and/or Respiratory History:210120523} Neurological: {Hx; Neurological:210120504} Psychological-Psychiatric: {Hx; Psychological-Psychiatric History:210120512} Gastrointestinal: {Hx; Gastrointestinal:210120527} Genitourinary: {Hx; Genitourinary:210120506} Hematological: {Hx; Hematological:210120510} Endocrine: {Hx; Endocrine history:210120509} Rheumatologic: {Hx; Rheumatological:210120530} Musculoskeletal: {Hx; Musculoskeletal:210120528} Work History: {Hx; Work history:210120514}  Allergies  Ms. Minor has No Known Allergies.  Laboratory Chemistry Profile   Renal Lab Results  Component Value Date   BUN 13 08/05/2022   CREATININE 0.81 08/05/2022   BCR 14 05/31/2022   GFRAA >60 12/06/2013   GFRNONAA >60 08/05/2022   SPECGRAV 1.015 02/25/2022   PHUR 6.5 02/25/2022   PROTEINUR 30 (A) 08/27/2022     Electrolytes Lab Results  Component Value Date   NA 138 08/05/2022   K 3.9 10/07/2022   CL 103 08/05/2022   CALCIUM 9.3 08/05/2022   MG 1.8 08/05/2022     Hepatic Lab Results  Component Value Date   AST 26 08/05/2022   ALT 20 08/05/2022   ALBUMIN 4.2 08/05/2022   ALKPHOS 97 08/05/2022   LIPASE 55 (H) 03/21/2021     ID Lab Results  Component Value Date   PREGTESTUR NEGATIVE 03/21/2021     Bone Lab Results  Component Value Date   VD25OH 51.2 05/31/2022     Endocrine Lab Results  Component Value Date   GLUCOSE 112 (H) 08/05/2022   GLUCOSEU NEGATIVE 08/27/2022   HGBA1C 6.4 (A) 10/04/2022   TSH 1.740 05/31/2022   FREET4 1.19 05/31/2022      Neuropathy Lab Results  Component Value Date   VITAMINB12 431 05/31/2022   FOLATE 19.4 05/31/2022   HGBA1C 6.4 (A) 10/04/2022     CNS No results found for: "COLORCSF", "APPEARCSF", "RBCCOUNTCSF", "WBCCSF", "POLYSCSF", "LYMPHSCSF", "EOSCSF", "PROTEINCSF", "GLUCCSF", "JCVIRUS", "CSFOLI", "IGGCSF", "LABACHR", "ACETBL"   Inflammation (CRP: Acute  ESR: Chronic) Lab Results  Component Value Date   ESRSEDRATE 9 05/31/2022     Rheumatology Lab Results  Component Value Date   RF <10.0 05/31/2022   ANA Positive (A) 05/31/2022   LABURIC 4.5 03/27/2018     Coagulation Lab Results  Component Value Date   PLT 265 08/05/2022     Cardiovascular Lab Results  Component Value Date   BNP 11.0 08/05/2022   TROPONINI < 0.02 12/06/2013   HGB 14.7 08/05/2022   HCT 44.2 08/05/2022     Screening Lab Results  Component Value Date   PREGTESTUR NEGATIVE 03/21/2021     Cancer No results found for: "CEA", "CA125", "LABCA2"  Allergens No results found for: "ALMOND", "APPLE", "ASPARAGUS", "AVOCADO", "BANANA", "BARLEY", "BASIL", "BAYLEAF", "GREENBEAN", "LIMABEAN", "WHITEBEAN", "BEEFIGE", "REDBEET", "BLUEBERRY", "BROCCOLI", "CABBAGE", "MELON", "CARROT", "CASEIN", "CASHEWNUT", "CAULIFLOWER", "CELERY"     Note: Lab results reviewed.  PFSH  Drug: Ms. Berro  reports that she does not currently use drugs after having used the following drugs: Marijuana. Alcohol:  reports no history of alcohol use. Tobacco:  reports that she has been smoking cigarettes. She has never used smokeless tobacco. Medical:  has a past medical history of Abscess of buttock, left, Diabetes mellitus without complication (HCC), Hyperlipidemia, Hypertension, and Tobacco use disorder (03/26/2021). Family: family history includes Hypertension in Kelsey Sampson mother.  Past Surgical History:  Procedure Laterality Date   ABDOMINAL HYSTERECTOMY     COLONOSCOPY WITH PROPOFOL N/A 10/15/2021   Procedure: COLONOSCOPY WITH PROPOFOL;   Surgeon: Wyline Mood, MD;  Location: [facility] ENDOSCOPY;  Service: Gastroenterology;  Laterality: N/A;   cyst removed Left    52 years old cyst removed   Active Ambulatory Problems    Diagnosis Date Noted   Morbid obesity (HCC) 03/26/2021   Incarcerated ventral hernia 03/26/2021   Tobacco use disorder 03/26/2021   Fibromyalgia 08/18/2022   Chronic pain syndrome 11/08/2022   Pharmacologic therapy 11/08/2022   Disorder of skeletal system 11/08/2022   Problems influencing health status 11/08/2022   Resolved Ambulatory Problems    Diagnosis Date Noted   Abscess of buttock, left    Past Medical History:  Diagnosis Date   Diabetes mellitus without complication (HCC)    Hyperlipidemia    Hypertension    Constitutional Exam  General appearance: Well nourished, well developed, and well hydrated. In no apparent acute distress There were no vitals filed for this visit. BMI Assessment: Estimated body mass index is 39.86 kg/m as calculated from the following:   Height as of 10/04/22: 5\' 9"  (1.753 m).   Weight as of 10/04/22: 269 lb 14.4 oz (122.4 kg).  BMI interpretation table: BMI level Category Range association with higher incidence of chronic pain  <18 kg/m2 Underweight   18.5-24.9 kg/m2 Ideal body weight   25-29.9 kg/m2 Overweight Increased incidence by 20%  30-34.9 kg/m2 Obese (Class I) Increased incidence by 68%  35-39.9 kg/m2 Severe obesity (Class II) Increased incidence by 136%  >40 kg/m2 Extreme obesity (Class III) Increased incidence by 254%   Patient's current BMI Ideal Body weight  There is no height or weight on file to calculate BMI. Patient weight not recorded   BMI Readings from Last 4 Encounters:  10/04/22 39.86 kg/m  08/30/22 39.05 kg/m  08/27/22 38.40 kg/m  08/05/22 39.87 kg/m   Wt Readings from Last 4 Encounters:  10/04/22 269 lb 14.4 oz (122.4 kg)  08/30/22 264 lb 6.4 oz (119.9 kg)  08/27/22 260 lb (117.9 kg)  08/05/22 270 lb (122.5 kg)    Psych/Mental  status: Alert, oriented x 3 (person, place, & time)       Eyes: PERLA Respiratory: No evidence of acute respiratory distress  Assessment  Primary Diagnosis & Pertinent Problem List: The primary encounter diagnosis was Chronic pain syndrome. Diagnoses of Pharmacologic therapy, Disorder of skeletal system, and Problems influencing health status were also pertinent to this visit.  Visit Diagnosis (New problems to examiner): 1. Chronic pain syndrome   2. Pharmacologic therapy   3. Disorder of skeletal system   4. Problems influencing health status    Plan of Care (Initial workup plan)  Note: Ms. Mikolajczak was reminded that as per protocol, today's visit has been  an evaluation only. We have not taken over the patient's controlled substance management.  Problem-specific plan: No problem-specific Assessment & Plan notes found for this encounter.  Lab Orders  No laboratory test(s) ordered today   Imaging Orders  No imaging studies ordered today   Referral Orders  No referral(s) requested today   Procedure Orders    No procedure(s) ordered today   Pharmacotherapy (current): Medications ordered:  No orders of the defined types were placed in this encounter.  Medications administered during this visit: Kelsey Sampson had no medications administered during this visit.   Analgesic Pharmacotherapy:  Opioid Analgesics: For patients currently taking or requesting to take opioid analgesics, in accordance with [facility] Guidelines, we will assess their risks and indications for the use of these substances. After completing our evaluation, we may offer recommendations, but we no longer take patients for medication management. The prescribing physician will ultimately decide, based on his/Kelsey Sampson training and level of comfort whether to adopt any of the recommendations, including whether or not to prescribe such medicines.  Membrane stabilizer: To be determined at a later time   Muscle relaxant: To be determined at a later time  NSAID: To be determined at a later time  Other analgesic(s): To be determined at a later time   Interventional management options: Ms. Miles was informed that there is no guarantee that she would be a candidate for interventional therapies. The decision will be based on the results of diagnostic studies, as well as Ms. Seyer's risk profile.  Procedure(s) under consideration:  Pending results of ordered studies      Interventional Therapies  Risk Factors  Considerations  Medical Comorbidities:     Planned  Pending:      Under consideration:   Pending   Completed:   None at this time   Therapeutic  Palliative (PRN) options:   None established   Completed by other providers:   None reported       Provider-requested follow-up: No follow-ups on file.  Future Appointments  Date Time Provider Department Center  11/08/2022  3:00 PM McDonough, Salomon Fick, PA-C [facility] None  11/10/2022  1:00 PM Delano Metz, MD [facility] None    Duration of encounter: *** minutes.  Total time on encounter, as per AMA guidelines included both the face-to-face and non-face-to-face time personally spent by the physician and/or other qualified health care professional(s) on the day of the encounter (includes time in activities that require the physician or other qualified health care professional and does not include time in activities normally performed by clinical staff). Physician's time may include the following activities when performed: Preparing to see the patient (e.g., pre-charting review of records, searching for previously ordered imaging, lab work, and nerve conduction tests) Review of prior analgesic pharmacotherapies. Reviewing PMP Interpreting ordered tests (e.g., lab work, imaging, nerve conduction tests) Performing post-procedure evaluations, including interpretation of diagnostic procedures Obtaining and/or reviewing  separately obtained history Performing a medically appropriate examination and/or evaluation Counseling and educating the patient/family/caregiver Ordering medications, tests, or procedures Referring and communicating with other health care professionals (when not separately reported) Documenting clinical information in the electronic or other health record Independently interpreting results (not separately reported) and communicating results to the patient/ family/caregiver Care coordination (not separately reported)  Note by: Oswaldo Done, MD (TTS technology used. I apologize for any typographical errors that were not detected and corrected.) Date: 11/10/2022; Time: 12:54 PM

## 2022-11-09 ENCOUNTER — Telehealth: Payer: Self-pay

## 2022-11-09 LAB — UA/M W/RFLX CULTURE, ROUTINE
Bilirubin, UA: NEGATIVE
Glucose, UA: NEGATIVE
Leukocytes,UA: NEGATIVE
Nitrite, UA: NEGATIVE
Protein,UA: NEGATIVE
RBC, UA: NEGATIVE
Specific Gravity, UA: >=1.03 — AB (ref 1.005–1.030)
Urobilinogen, Ur: 0.2 mg/dL (ref 0.2–1.0)
pH, UA: 5 (ref 5.0–7.5)

## 2022-11-09 LAB — MICROSCOPIC EXAMINATION
Bacteria, UA: NONE SEEN
Casts: NONE SEEN /LPF

## 2022-11-09 NOTE — Telephone Encounter (Signed)
Completed appeal for patient's Rybelsus 7mg .

## 2022-11-10 ENCOUNTER — Ambulatory Visit: Payer: 59 | Attending: Pain Medicine | Admitting: Pain Medicine

## 2022-11-10 ENCOUNTER — Encounter: Payer: Self-pay | Admitting: Pain Medicine

## 2022-11-10 VITALS — BP 126/91 | HR 96 | Temp 97.2°F | Resp 16 | Ht 69.0 in | Wt 264.0 lb

## 2022-11-10 DIAGNOSIS — Z789 Other specified health status: Secondary | ICD-10-CM

## 2022-11-10 DIAGNOSIS — M545 Low back pain, unspecified: Secondary | ICD-10-CM

## 2022-11-10 DIAGNOSIS — M25561 Pain in right knee: Secondary | ICD-10-CM | POA: Diagnosis not present

## 2022-11-10 DIAGNOSIS — M899 Disorder of bone, unspecified: Secondary | ICD-10-CM

## 2022-11-10 DIAGNOSIS — M5459 Other low back pain: Secondary | ICD-10-CM

## 2022-11-10 DIAGNOSIS — M25562 Pain in left knee: Secondary | ICD-10-CM

## 2022-11-10 DIAGNOSIS — M533 Sacrococcygeal disorders, not elsewhere classified: Secondary | ICD-10-CM

## 2022-11-10 DIAGNOSIS — M25511 Pain in right shoulder: Secondary | ICD-10-CM

## 2022-11-10 DIAGNOSIS — M25512 Pain in left shoulder: Secondary | ICD-10-CM

## 2022-11-10 DIAGNOSIS — M25551 Pain in right hip: Secondary | ICD-10-CM

## 2022-11-10 DIAGNOSIS — M47816 Spondylosis without myelopathy or radiculopathy, lumbar region: Secondary | ICD-10-CM

## 2022-11-10 DIAGNOSIS — M25552 Pain in left hip: Secondary | ICD-10-CM | POA: Diagnosis not present

## 2022-11-10 DIAGNOSIS — Z79899 Other long term (current) drug therapy: Secondary | ICD-10-CM

## 2022-11-10 DIAGNOSIS — G894 Chronic pain syndrome: Secondary | ICD-10-CM | POA: Diagnosis not present

## 2022-11-10 DIAGNOSIS — G8929 Other chronic pain: Secondary | ICD-10-CM | POA: Insufficient documentation

## 2022-11-11 LAB — COMP. METABOLIC PANEL (12)
AST: 17 IU/L (ref 0–40)
Albumin: 4.1 g/dL (ref 3.8–4.9)
Alkaline Phosphatase: 143 IU/L — ABNORMAL HIGH (ref 44–121)
BUN/Creatinine Ratio: 12 (ref 9–23)
BUN: 9 mg/dL (ref 6–24)
Bilirubin Total: <0.2 mg/dL (ref 0.0–1.2)
Calcium: 9.6 mg/dL (ref 8.7–10.2)
Chloride: 100 mmol/L (ref 96–106)
Creatinine, Ser: 0.75 mg/dL (ref 0.57–1.00)
Globulin, Total: 2.8 g/dL (ref 1.5–4.5)
Glucose: 121 mg/dL — ABNORMAL HIGH (ref 70–99)
Potassium: 4 mmol/L (ref 3.5–5.2)
Sodium: 139 mmol/L (ref 134–144)
Total Protein: 6.9 g/dL (ref 6.0–8.5)
eGFR: 96 mL/min/{1.73_m2} (ref >59)

## 2022-11-11 LAB — 25-HYDROXY VITAMIN D LCMS D2+D3

## 2022-11-11 LAB — SEDIMENTATION RATE: Sed Rate: 34 mm/hr (ref 0–40)

## 2022-11-11 LAB — VITAMIN B12: Vitamin B-12: 413 pg/mL (ref 232–1245)

## 2022-11-11 LAB — MAGNESIUM: Magnesium: 1.6 mg/dL (ref 1.6–2.3)

## 2022-11-11 LAB — C-REACTIVE PROTEIN: CRP: 7 mg/L (ref 0–10)

## 2022-11-17 LAB — COMPLIANCE DRUG ANALYSIS, UR

## 2022-11-19 ENCOUNTER — Other Ambulatory Visit: Payer: 59
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
## 2022-12-06 ENCOUNTER — Ambulatory Visit: Payer: 59 | Admitting: Pain Medicine

## 2022-12-07 ENCOUNTER — Inpatient Hospital Stay: Admission: RE | Admit: 2022-12-07 | Payer: 59 | Source: Ambulatory Visit

## 2022-12-07 ENCOUNTER — Other Ambulatory Visit: Payer: 59

## 2022-12-13 ENCOUNTER — Other Ambulatory Visit: Payer: Self-pay | Admitting: Physician Assistant

## 2022-12-13 DIAGNOSIS — G8929 Other chronic pain: Secondary | ICD-10-CM

## 2022-12-21 MED ORDER — CYCLOBENZAPRINE HCL 10 MG PO TABS
10.0000 mg | ORAL_TABLET | Freq: Every day | ORAL | 0 refills | Status: DC | PRN
Start: 2022-12-21 — End: 2023-01-15

## 2023-01-06 ENCOUNTER — Other Ambulatory Visit: Payer: Self-pay | Admitting: Physician Assistant

## 2023-01-06 DIAGNOSIS — B372 Candidiasis of skin and nail: Secondary | ICD-10-CM

## 2023-01-06 MED ORDER — NYSTATIN-TRIAMCINOLONE 100000-0.1 UNIT/GM-% EX OINT: 1.0000 | TOPICAL_OINTMENT | Freq: Two times a day (BID) | CUTANEOUS | 0 refills | Status: DC

## 2023-01-14 ENCOUNTER — Other Ambulatory Visit: Payer: Self-pay | Admitting: Physician Assistant

## 2023-01-14 DIAGNOSIS — G8929 Other chronic pain: Secondary | ICD-10-CM

## 2023-02-05 ENCOUNTER — Other Ambulatory Visit: Payer: Self-pay | Admitting: Physician Assistant

## 2023-02-07 ENCOUNTER — Ambulatory Visit (INDEPENDENT_AMBULATORY_CARE_PROVIDER_SITE_OTHER): Payer: Medicaid Other | Admitting: Physician Assistant

## 2023-02-07 ENCOUNTER — Encounter: Payer: Self-pay | Admitting: Physician Assistant

## 2023-02-07 VITALS — BP 110/80 | HR 86 | Temp 98.3°F | Resp 16 | Ht 69.0 in | Wt 271.6 lb

## 2023-02-07 DIAGNOSIS — F320 Major depressive disorder, single episode, mild: Secondary | ICD-10-CM | POA: Diagnosis not present

## 2023-02-07 DIAGNOSIS — E1159 Type 2 diabetes mellitus with other circulatory complications: Secondary | ICD-10-CM

## 2023-02-07 DIAGNOSIS — M2550 Pain in unspecified joint: Secondary | ICD-10-CM | POA: Diagnosis not present

## 2023-02-07 DIAGNOSIS — I10 Essential (primary) hypertension: Secondary | ICD-10-CM

## 2023-02-07 DIAGNOSIS — E782 Mixed hyperlipidemia: Secondary | ICD-10-CM

## 2023-02-07 LAB — POCT GLYCOSYLATED HEMOGLOBIN (HGB A1C): Hemoglobin A1C: 6.6 % — AB (ref 4.0–5.6)

## 2023-02-07 MED ORDER — ROSUVASTATIN CALCIUM 5 MG PO TABS: 5.0000 mg | ORAL_TABLET | Freq: Every day | ORAL | 1 refills | Status: AC

## 2023-02-07 MED ORDER — DULOXETINE HCL 20 MG PO CPEP: 20.0000 mg | ORAL_CAPSULE | Freq: Every day | ORAL | 3 refills | Status: DC

## 2023-02-07 MED ORDER — ACCU-CHEK GUIDE ME W/DEVICE KIT: PACK | 0 refills | Status: DC

## 2023-02-07 MED ORDER — METFORMIN HCL 500 MG PO TABS
500.0000 mg | ORAL_TABLET | Freq: Every day | ORAL | 1 refills | Status: AC
Start: 2023-02-07 — End: ?

## 2023-02-07 NOTE — Progress Notes (Cosign Needed)
[facility] [address] [place], [place] [address]  Internal MEDICINE  Office Visit Note  Patient Name: Kelsey Sampson  [id number]  [id number]  Date of Service: 02/22/2023  Chief Complaint  Patient presents with   Follow-up   Diabetes   Hypertension   Hyperlipidemia    HPI Pt is here for routine follow up -Did not like rybelsus stopped this. Taking metformin. Does need meter -has to follow up with pain provider, has to get xrays still. Had gotten sick and delayed getting this.  -needs to reschedule mammogram -needs med refills -BP well controlled -still following with [facility] for hernia, had been trying to lose weight for surgery  Current Medication: Outpatient Encounter Medications as of 02/07/2023  Medication Sig   Ascorbic Acid (VITAMIN C PO) Take 1 tablet by mouth daily.   Blood Glucose Monitoring Suppl (ACCU-CHEK GUIDE ME) w/Device KIT Use as directed. E11.65   hydrochlorothiazide (HYDRODIURIL) 25 MG tablet TAKE 1 TABLET (25 MG TOTAL) BY MOUTH DAILY.   meloxicam (MOBIC) 15 MG tablet Take 15 mg by mouth 3 (three) times daily.   Multiple Vitamin (MULTIVITAMIN WITH MINERALS) TABS tablet Take 1 tablet by mouth daily.   Na Sulfate-K Sulfate-Mg Sulf 17.5-3.13-1.6 GM/177ML SOLN Take by mouth as directed.   nystatin-triamcinolone ointment (MYCOLOG) Apply 1 Application topically 2 (two) times daily.   traMADol (ULTRAM) 50 MG tablet Take 50 mg by mouth daily at 6 (six) AM.   traZODone (DESYREL) 50 MG tablet TAKE 1 TABLET BY MOUTH EVERYDAY AT BEDTIME   triamcinolone cream (KENALOG) 0.1 % APPLY TO AFFECTED AREA TWICE A DAY   [DISCONTINUED] cyclobenzaprine (FLEXERIL) 10 MG tablet TAKE 1 TABLET BY MOUTH DAILY AS NEEDED FOR MUSCLE SPASMS.   [DISCONTINUED] DULoxetine (CYMBALTA) 20 MG capsule Take 1 capsule (20 mg total) by mouth daily.   [DISCONTINUED] metFORMIN (GLUCOPHAGE) 500 MG tablet Take 1 tablet (500 mg total) by mouth daily with breakfast.   [DISCONTINUED]  rosuvastatin (CRESTOR) 5 MG tablet TAKE 1 TABLET (5 MG TOTAL) BY MOUTH DAILY.   [DISCONTINUED] Semaglutide (RYBELSUS) 7 MG TABS Take 1 tablet (7 mg total) by mouth daily.   [DISCONTINUED] traZODone (DESYREL) 50 MG tablet Take 1 tablet by mouth at bedtime.   DULoxetine (CYMBALTA) 20 MG capsule Take 1 capsule (20 mg total) by mouth daily.   metFORMIN (GLUCOPHAGE) 500 MG tablet Take 1 tablet (500 mg total) by mouth daily with breakfast.   rosuvastatin (CRESTOR) 5 MG tablet Take 1 tablet (5 mg total) by mouth daily.   No facility-administered encounter medications on file as of 02/07/2023.    Surgical History: Past Surgical History:  Procedure Laterality Date   ABDOMINAL HYSTERECTOMY     COLONOSCOPY WITH PROPOFOL N/A 10/15/2021   Procedure: COLONOSCOPY WITH PROPOFOL;  Surgeon: Wyline Mood, MD;  Location: [facility] ENDOSCOPY;  Service: Gastroenterology;  Laterality: N/A;   cyst removed Left    52 years old cyst removed    Medical History: Past Medical History:  Diagnosis Date   Abscess of buttock, left    Diabetes mellitus without complication (HCC)    Hyperlipidemia    Hypertension    Tobacco use disorder 03/26/2021    Family History: Family History  Problem Relation Age of Onset   Hypertension Mother     Social History   Socioeconomic History   Marital status: Single    Spouse name: Not on file   Number of children: Not on file   Years of education: Not on file  Highest education level: Not on file  Occupational History   Not on file  Tobacco Use   Smoking status: Every Day    Current packs/day: 0.00    Types: Cigarettes    Last attempt to quit: 11/30/2020    Years since quitting: 2.2   Smokeless tobacco: Never   Tobacco comments:    1 pack every 2 days.  Vaping Use   Vaping status: Some Days   Substances: Flavoring  Substance and Sexual Activity   Alcohol use: No   Drug use: Not Currently    Types: Marijuana    Comment: over 30 days ago   Sexual activity: Not  Currently    Partners: Male  Other Topics Concern   Not on file  Social History Narrative   Not on file   Social Drivers of Health   Financial Resource Strain: Not on file  Food Insecurity: Not on file  Transportation Needs: Not on file  Physical Activity: Not on file  Stress: Not on file  Social Connections: Not on file  Intimate Partner Violence: Not At Risk (04/02/2021)   Humiliation, Afraid, Rape, and Kick questionnaire    Fear of Current or Ex-Partner: No    Emotionally Abused: No    Physically Abused: No    Sexually Abused: No      Review of Systems  Constitutional:  Negative for chills, fatigue and unexpected weight change.  HENT:  Negative for congestion, postnasal drip, rhinorrhea, sneezing and sore throat.   Eyes:  Negative for redness.  Respiratory:  Negative for cough, chest tightness and shortness of breath.   Cardiovascular:  Negative for chest pain and palpitations.  Gastrointestinal:  Negative for abdominal pain, constipation, diarrhea, nausea and vomiting.  Genitourinary:  Negative for dysuria and frequency.  Musculoskeletal:  Positive for arthralgias and back pain. Negative for joint swelling and neck pain.  Skin:  Negative for rash.  Neurological: Negative.  Negative for tremors and numbness.  Hematological:  Negative for adenopathy. Does not bruise/bleed easily.  Psychiatric/Behavioral:  Negative for behavioral problems (Depression) and suicidal ideas. The patient is not nervous/anxious.     Vital Signs: BP 110/80   Pulse 86   Temp 98.3 F (36.8 C)   Resp 16   Ht 5\' 9"  (1.753 m)   Wt 271 lb 9.6 oz (123.2 kg)   SpO2 98%   BMI 40.11 kg/m    Physical Exam Vitals and nursing note reviewed.  Constitutional:      General: She is not in acute distress.    Appearance: Normal appearance. She is well-developed. She is obese. She is not diaphoretic.  HENT:     Head: Normocephalic and atraumatic.     Mouth/Throat:     Pharynx: No oropharyngeal  exudate.  Eyes:     Pupils: Pupils are equal, round, and reactive to light.  Neck:     Thyroid: No thyromegaly.     Vascular: No JVD.     Trachea: No tracheal deviation.  Cardiovascular:     Rate and Rhythm: Normal rate and regular rhythm.     Heart sounds: Normal heart sounds. No murmur heard.    No friction rub. No gallop.  Pulmonary:     Effort: Pulmonary effort is normal. No respiratory distress.     Breath sounds: No wheezing or rales.  Chest:     Chest wall: No tenderness.  Abdominal:     General: Bowel sounds are normal.     Palpations: Abdomen is soft.  Tenderness: There is no abdominal tenderness.     Hernia: A hernia is present.  Musculoskeletal:        General: Normal range of motion.     Cervical back: Normal range of motion and neck supple.  Lymphadenopathy:     Cervical: No cervical adenopathy.  Skin:    General: Skin is warm and dry.  Neurological:     Mental Status: She is alert and oriented to person, place, and time.     Cranial Nerves: No cranial nerve deficit.  Psychiatric:        Behavior: Behavior normal.        Thought Content: Thought content normal.        Judgment: Judgment normal.        Assessment/Plan: 1. Type 2 diabetes mellitus with other circulatory complication, without long-term current use of insulin (HCC) (Primary) - Urine Microalbumin w/creat. ratio - POCT HgB A1C is 6.6 which is up from 6.4 last visit. Continue metformin, stopped rybelsus--did not like this. Will start monitoring - metFORMIN (GLUCOPHAGE) 500 MG tablet; Take 1 tablet (500 mg total) by mouth daily with breakfast.  Dispense: 90 tablet; Refill: 1 - Blood Glucose Monitoring Suppl (ACCU-CHEK GUIDE ME) w/Device KIT; Use as directed. E11.65  Dispense: 1 kit; Refill: 0  2. Essential hypertension Stable, continue current medications  3. Polyarthralgia Seeing pain clinic now - DULoxetine (CYMBALTA) 20 MG capsule; Take 1 capsule (20 mg total) by mouth daily.  Dispense:  30 capsule; Refill: 3  4. Depression, major, single episode, mild (HCC) - DULoxetine (CYMBALTA) 20 MG capsule; Take 1 capsule (20 mg total) by mouth daily.  Dispense: 30 capsule; Refill: 3  5. Mixed hyperlipidemia - rosuvastatin (CRESTOR) 5 MG tablet; Take 1 tablet (5 mg total) by mouth daily.  Dispense: 90 tablet; Refill: 1   General Counseling: Kelsey Sampson verbalizes understanding of the findings of todays visit and agrees with plan of treatment. I have discussed any further diagnostic evaluation that may be needed or ordered today. We also reviewed her medications today. she has been encouraged to call the office with any questions or concerns that should arise related to todays visit.    Orders Placed This Encounter  Procedures   Urine Microalbumin w/creat. ratio   POCT HgB A1C    Meds ordered this encounter  Medications   metFORMIN (GLUCOPHAGE) 500 MG tablet    Sig: Take 1 tablet (500 mg total) by mouth daily with breakfast.    Dispense:  90 tablet    Refill:  1   DULoxetine (CYMBALTA) 20 MG capsule    Sig: Take 1 capsule (20 mg total) by mouth daily.    Dispense:  30 capsule    Refill:  3   rosuvastatin (CRESTOR) 5 MG tablet    Sig: Take 1 tablet (5 mg total) by mouth daily.    Dispense:  90 tablet    Refill:  1   Blood Glucose Monitoring Suppl (ACCU-CHEK GUIDE ME) w/Device KIT    Sig: Use as directed. E11.65    Dispense:  1 kit    Refill:  0    This patient was seen by Lynn Ito, PA-C in collaboration with Dr. Beverely Risen as a part of collaborative care agreement.   Total time spent:30 Minutes Time spent includes review of chart, medications, test results, and follow up plan with the patient.      Dr Lyndon Code Internal medicine

## 2023-02-08 LAB — MICROALBUMIN / CREATININE URINE RATIO
Creatinine, Urine: 127.2 mg/dL
Microalb/Creat Ratio: <2 mg/g{creat} (ref 0–29)
Microalbumin, Urine: <3 ug/mL

## 2023-02-21 ENCOUNTER — Other Ambulatory Visit: Payer: Self-pay | Admitting: Physician Assistant

## 2023-02-21 DIAGNOSIS — M545 Low back pain, unspecified: Secondary | ICD-10-CM

## 2023-02-22 MED ORDER — CYCLOBENZAPRINE HCL 10 MG PO TABS: 10.0000 mg | ORAL_TABLET | Freq: Every day | ORAL | 0 refills | Status: DC | PRN

## 2023-04-17 ENCOUNTER — Other Ambulatory Visit: Payer: Self-pay | Admitting: Nurse Practitioner

## 2023-04-17 ENCOUNTER — Other Ambulatory Visit: Payer: Self-pay | Admitting: Physician Assistant

## 2023-04-17 DIAGNOSIS — G8929 Other chronic pain: Secondary | ICD-10-CM

## 2023-04-17 DIAGNOSIS — B372 Candidiasis of skin and nail: Secondary | ICD-10-CM

## 2023-05-23 ENCOUNTER — Other Ambulatory Visit: Payer: Self-pay | Admitting: Physician Assistant

## 2023-05-23 DIAGNOSIS — M545 Low back pain, unspecified: Secondary | ICD-10-CM

## 2023-06-13 ENCOUNTER — Encounter: Payer: Self-pay | Admitting: Physician Assistant

## 2023-06-13 ENCOUNTER — Ambulatory Visit: Payer: Medicaid Other | Admitting: Physician Assistant

## 2023-06-13 VITALS — BP 130/70 | HR 90 | Temp 98.7°F | Resp 16 | Ht 69.0 in | Wt 276.2 lb

## 2023-06-13 DIAGNOSIS — I10 Essential (primary) hypertension: Secondary | ICD-10-CM | POA: Diagnosis not present

## 2023-06-13 DIAGNOSIS — M797 Fibromyalgia: Secondary | ICD-10-CM

## 2023-06-13 DIAGNOSIS — M7989 Other specified soft tissue disorders: Secondary | ICD-10-CM

## 2023-06-13 DIAGNOSIS — E1159 Type 2 diabetes mellitus with other circulatory complications: Secondary | ICD-10-CM

## 2023-06-13 DIAGNOSIS — M26629 Arthralgia of temporomandibular joint, unspecified side: Secondary | ICD-10-CM

## 2023-06-13 LAB — POCT GLYCOSYLATED HEMOGLOBIN (HGB A1C): Hemoglobin A1C: 6.5 % — AB (ref 4.0–5.6)

## 2023-06-13 MED ORDER — DULOXETINE HCL 30 MG PO CPEP: 30.0000 mg | ORAL_CAPSULE | Freq: Every day | ORAL | 3 refills | Status: DC

## 2023-06-13 NOTE — Progress Notes (Signed)
 [facility] [address] [place], [place] [address]  Internal MEDICINE  Office Visit Note  Patient Name: Kelsey Sampson  [id number]  [id number]  Date of Service: 06/13/2023  Chief Complaint  Patient presents with   Follow-up   Diabetes   Hypertension   Hyperlipidemia   Skin Problem    Bruising easily on legs.    HPI Pt is here for routine follow up --Had an instance of right side face swelling and was hard to chew like jaw hurting, but no tooth pain. This resolved on its own. No trouble with swallowing or breathing. Does have hx of jaw clicking. 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 Discussed increasing cymbalta  to try to help -some ankle swelling, goes away with elevation. Will try compression stockings. Has been wearing crocs instead of tennis shoes recently and needs to switch back  Current Medication: Outpatient Encounter Medications as of 06/13/2023  Medication Sig   Ascorbic Acid (VITAMIN C PO) Take 1 tablet by mouth daily.   Blood Glucose Monitoring Suppl (ACCU-CHEK GUIDE ME) w/Device KIT Use as directed. E11.65   DULoxetine  (CYMBALTA ) 30 MG capsule Take 1 capsule (30 mg total) by mouth daily.   hydrochlorothiazide  (HYDRODIURIL ) 25 MG tablet TAKE 1 TABLET (25 MG TOTAL) BY MOUTH DAILY.   meloxicam  (MOBIC ) 15 MG tablet Take 15 mg by mouth 3 (three) times daily.   metFORMIN  (GLUCOPHAGE ) 500 MG tablet Take 1 tablet (500 mg total) by mouth daily with breakfast.   Multiple Vitamin (MULTIVITAMIN WITH MINERALS) TABS tablet Take 1 tablet by mouth daily.   Na Sulfate-K Sulfate-Mg Sulf 17.5-3.13-1.6 GM/177ML SOLN Take by mouth as directed.   nystatin -triamcinolone  ointment (MYCOLOG) Apply 1 Application topically 2 (two) times daily.    rosuvastatin  (CRESTOR ) 5 MG tablet Take 1 tablet (5 mg total) by mouth daily.   traMADol  (ULTRAM ) 50 MG tablet Take 50 mg by mouth daily at 6 (six) AM.   traZODone  (DESYREL ) 50 MG tablet TAKE 1 TABLET BY MOUTH EVERYDAY AT BEDTIME   triamcinolone  cream (KENALOG ) 0.1 % APPLY TO AFFECTED AREA TWICE A DAY   [DISCONTINUED] cyclobenzaprine  (FLEXERIL ) 10 MG tablet TAKE 1 TABLET BY MOUTH DAILY AS NEEDED FOR MUSCLE SPASMS.   [DISCONTINUED] DULoxetine  (CYMBALTA ) 20 MG capsule Take 1 capsule (20 mg total) by mouth daily.   No facility-administered encounter medications on file as of 06/13/2023.    Surgical History: Past Surgical History:  Procedure Laterality Date   ABDOMINAL HYSTERECTOMY     COLONOSCOPY WITH PROPOFOL  N/A 10/15/2021   Procedure: COLONOSCOPY WITH PROPOFOL ;  Surgeon: Luke Salaam, MD;  Location: [facility] ENDOSCOPY;  Service: Gastroenterology;  Laterality: N/A;   cyst removed Left    53 years old cyst removed    Medical History: Past Medical History:  Diagnosis Date   Abscess of buttock, left    Diabetes mellitus without complication (HCC)    Hyperlipidemia    Hypertension    Tobacco use disorder 03/26/2021    Family History: Family History  Problem Relation Age of Onset   Hypertension Mother     Social History   Socioeconomic History   Marital status: Single    Spouse name: Not on file   Number of children: Not on file   Years of education: Not on file  Highest education level: Not on file  Occupational History   Not on file  Tobacco Use   Smoking status: Every Day    Current packs/day: 0.00    Types: Cigarettes    Last attempt to quit: 11/30/2020    Years since quitting: 2.5   Smokeless tobacco: Never   Tobacco comments:    1 pack every 2 days.  Vaping Use   Vaping status: Some Days   Substances: Flavoring  Substance and Sexual Activity   Alcohol use: No   Drug use: Not Currently    Types: Marijuana    Comment: over 30 days ago   Sexual activity: Not  Currently    Partners: Male  Other Topics Concern   Not on file  Social History Narrative   Not on file   Social Drivers of Health   Financial Resource Strain: Not on file  Food Insecurity: Not on file  Transportation Needs: Not on file  Physical Activity: Not on file  Stress: Not on file  Social Connections: Not on file  Intimate Partner Violence: Not At Risk (04/02/2021)   Humiliation, Afraid, Rape, and Kick questionnaire    Fear of Current or Ex-Partner: No    Emotionally Abused: No    Physically Abused: No    Sexually Abused: No      Review of Systems  Constitutional:  Negative for chills, fatigue and unexpected weight change.  HENT:  Negative for congestion, postnasal drip, rhinorrhea, sneezing and sore throat.   Eyes:  Negative for redness.  Respiratory:  Negative for cough, chest tightness and shortness of breath.   Cardiovascular:  Positive for leg swelling. Negative for chest pain and palpitations.  Gastrointestinal:  Negative for abdominal pain, constipation, diarrhea, nausea and vomiting.  Genitourinary:  Negative for dysuria and frequency.  Musculoskeletal:  Positive for arthralgias, back pain and myalgias. Negative for joint swelling and neck pain.  Skin:  Negative for rash.  Neurological: Negative.  Negative for tremors and numbness.  Hematological:  Negative for adenopathy. Bruises/bleeds easily.  Psychiatric/Behavioral:  Negative for behavioral problems (Depression) and suicidal ideas. The patient is not nervous/anxious.     Vital Signs: BP 130/70   Pulse 90 Comment: 109  Temp 98.7 F (37.1 C)   Resp 16   Ht 5\' 9"  (1.753 m)   Wt 276 lb 3.2 oz (125.3 kg)   SpO2 98%   BMI 40.79 kg/m    Physical Exam Vitals and nursing note reviewed.  Constitutional:      General: She is not in acute distress.    Appearance: Normal appearance. She is well-developed. She is obese. She is not diaphoretic.  HENT:     Head: Normocephalic and atraumatic.      Mouth/Throat:     Pharynx: No oropharyngeal exudate.  Eyes:     Pupils: Pupils are equal, round, and reactive to light.  Neck:     Thyroid: No thyromegaly.     Vascular: No JVD.     Trachea: No tracheal deviation.  Cardiovascular:     Rate and Rhythm: Normal rate and regular rhythm.     Heart sounds: Normal heart sounds. No murmur heard.    No friction rub. No gallop.  Pulmonary:     Effort: Pulmonary effort is normal. No respiratory distress.     Breath sounds: No wheezing or rales.  Chest:     Chest wall: No tenderness.  Musculoskeletal:        General: Normal range of motion.  Cervical back: Normal range of motion and neck supple.     Right lower leg: Edema present.     Left lower leg: Edema present.  Lymphadenopathy:     Cervical: No cervical adenopathy.  Skin:    General: Skin is warm and dry.  Neurological:     Mental Status: She is alert and oriented to person, place, and time.  Psychiatric:        Behavior: Behavior normal.        Thought Content: Thought content normal.        Judgment: Judgment normal.        Assessment/Plan: 1. Type 2 diabetes mellitus with other circulatory complication, without long-term current use of insulin (HCC) (Primary) - POCT HgB A1C is 6.5 which is improved from 6.6 last visit, continue metformin  and working on diet and exercise  2. Fibromyalgia Will increase cymbalta  - DULoxetine  (CYMBALTA ) 30 MG capsule; Take 1 capsule (30 mg total) by mouth daily.  Dispense: 30 capsule; Refill: 3  3. Localized swelling of both lower extremities Discussed compression stockings and supportive shoes. Will also check echo - ECHOCARDIOGRAM COMPLETE; Future  4. Essential hypertension Controlled - ECHOCARDIOGRAM COMPLETE; Future  5. Arthralgia of temporomandibular joint, unspecified laterality Discussed possible TMJ and considering night guard and avoiding crunchy foods. Also discussed seeing dentist   General Counseling: Paycen verbalizes  understanding of the findings of todays visit and agrees with plan of treatment. I have discussed any further diagnostic evaluation that may be needed or ordered today. We also reviewed her medications today. she has been encouraged to call the office with any questions or concerns that should arise related to todays visit.    Orders Placed This Encounter  Procedures   POCT HgB A1C   ECHOCARDIOGRAM COMPLETE    Meds ordered this encounter  Medications   DULoxetine  (CYMBALTA ) 30 MG capsule    Sig: Take 1 capsule (30 mg total) by mouth daily.    Dispense:  30 capsule    Refill:  3    This patient was seen by Taylor Favia, PA-C in collaboration with Dr. Verneta Gone as a part of collaborative care agreement.   Total time spent:30 Minutes Time spent includes review of chart, medications, test results, and follow up plan with the patient.      Dr Fozia M Khan Internal medicine

## 2023-06-14 ENCOUNTER — Telehealth: Payer: Self-pay | Admitting: Physician Assistant

## 2023-06-14 NOTE — Telephone Encounter (Signed)
 Notified patient of echo appointment date, arrival time, location-[place]

## 2023-06-21 ENCOUNTER — Other Ambulatory Visit: Payer: Self-pay | Admitting: Physician Assistant

## 2023-06-21 DIAGNOSIS — M545 Low back pain, unspecified: Secondary | ICD-10-CM

## 2023-07-14 ENCOUNTER — Ambulatory Visit: Admission: RE | Admit: 2023-07-14 | Source: Ambulatory Visit

## 2023-07-19 ENCOUNTER — Other Ambulatory Visit: Payer: Self-pay | Admitting: Physician Assistant

## 2023-07-19 DIAGNOSIS — G8929 Other chronic pain: Secondary | ICD-10-CM

## 2023-08-05 ENCOUNTER — Ambulatory Visit: Admitting: Physician Assistant

## 2023-08-27 ENCOUNTER — Other Ambulatory Visit: Payer: Self-pay | Admitting: Physician Assistant

## 2023-08-27 DIAGNOSIS — M545 Low back pain, unspecified: Secondary | ICD-10-CM

## 2023-09-28 ENCOUNTER — Ambulatory Visit
Admission: RE | Admit: 2023-09-28 | Discharge: 2023-09-28 | Disposition: A | Discharge status: Home / Self Care | Source: Ambulatory Visit | Attending: Physician Assistant | Admitting: Physician Assistant

## 2023-09-28 DIAGNOSIS — R2243 Localized swelling, mass and lump, lower limb, bilateral: Secondary | ICD-10-CM | POA: Diagnosis not present

## 2023-09-28 DIAGNOSIS — I10 Essential (primary) hypertension: Secondary | ICD-10-CM | POA: Insufficient documentation

## 2023-09-28 DIAGNOSIS — M7989 Other specified soft tissue disorders: Secondary | ICD-10-CM | POA: Diagnosis present

## 2023-09-28 DIAGNOSIS — E785 Hyperlipidemia, unspecified: Secondary | ICD-10-CM | POA: Insufficient documentation

## 2023-09-28 LAB — ECHOCARDIOGRAM COMPLETE
AR max vel: 2.99 cm2
AV Area VTI: 3.31 cm2
AV Area mean vel: 2.65 cm2
AV Mean grad: 2.5 mmHg
AV Peak grad: 4.1 mmHg
Ao pk vel: 1.02 m/s
Area-P 1/2: 3.89 cm2
MV VTI: 2.75 cm2
S' Lateral: 2.5 cm

## 2023-09-28 NOTE — Progress Notes (Signed)
*  PRELIMINARY RESULTS* Echocardiogram 2D Echocardiogram has been performed.  Floydene Harder 09/28/2023, 11:50 AM

## 2023-10-11 ENCOUNTER — Other Ambulatory Visit: Payer: Self-pay | Admitting: Physician Assistant

## 2023-10-11 DIAGNOSIS — I10 Essential (primary) hypertension: Secondary | ICD-10-CM

## 2023-10-11 DIAGNOSIS — M545 Low back pain, unspecified: Secondary | ICD-10-CM

## 2023-10-11 NOTE — Telephone Encounter (Signed)
 Please review ok to do muscle relaxer

## 2023-10-11 NOTE — Telephone Encounter (Signed)
 Ok to do muscle relaxer

## 2023-10-20 ENCOUNTER — Encounter: Payer: Self-pay | Admitting: Physician Assistant

## 2023-10-20 ENCOUNTER — Ambulatory Visit (INDEPENDENT_AMBULATORY_CARE_PROVIDER_SITE_OTHER): Admitting: Physician Assistant

## 2023-10-20 VITALS — BP 128/70 | HR 96 | Temp 98.0°F | Resp 16 | Ht 69.0 in | Wt 270.0 lb

## 2023-10-20 DIAGNOSIS — R2689 Other abnormalities of gait and mobility: Secondary | ICD-10-CM

## 2023-10-20 DIAGNOSIS — R5383 Other fatigue: Secondary | ICD-10-CM

## 2023-10-20 DIAGNOSIS — M2550 Pain in unspecified joint: Secondary | ICD-10-CM

## 2023-10-20 DIAGNOSIS — I10 Essential (primary) hypertension: Secondary | ICD-10-CM

## 2023-10-20 DIAGNOSIS — E1159 Type 2 diabetes mellitus with other circulatory complications: Secondary | ICD-10-CM

## 2023-10-20 DIAGNOSIS — I517 Cardiomegaly: Secondary | ICD-10-CM

## 2023-10-20 DIAGNOSIS — M25531 Pain in right wrist: Secondary | ICD-10-CM

## 2023-10-20 DIAGNOSIS — R946 Abnormal results of thyroid function studies: Secondary | ICD-10-CM

## 2023-10-20 DIAGNOSIS — M79671 Pain in right foot: Secondary | ICD-10-CM

## 2023-10-20 DIAGNOSIS — E538 Deficiency of other specified B group vitamins: Secondary | ICD-10-CM

## 2023-10-20 DIAGNOSIS — E782 Mixed hyperlipidemia: Secondary | ICD-10-CM

## 2023-10-20 DIAGNOSIS — E559 Vitamin D deficiency, unspecified: Secondary | ICD-10-CM

## 2023-10-20 LAB — POCT GLYCOSYLATED HEMOGLOBIN (HGB A1C): Hemoglobin A1C: 6.6 % — AB (ref 4.0–5.6)

## 2023-10-20 MED ORDER — TIRZEPATIDE 2.5 MG/0.5ML ~~LOC~~ SOAJ: 2.5000 mg | SUBCUTANEOUS | 2 refills | Status: DC

## 2023-10-20 NOTE — Progress Notes (Signed)
 [facility] [address] [place], [place] [address]  Internal MEDICINE  Office Visit Note  Patient Name: Kelsey Sampson  [id number]  [id number]  Date of Service: 10/20/2023  Chief Complaint  Patient presents with   Follow-up    Review echo    Fall   Diabetes   Hypertension   Hyperlipidemia   Quality Metric Gaps    Mammogram    HPI Pt is here for routine follow up -States she had a fall 2 months ago, knees gave out on her and she caught herself on right wrist. Bump on back of wrist popped up a few weeks later. Did not hit head.  -right foot on inside/heel, painful during the day walking, but gets worse throughout the day and is worst at end of shift. Unclear if she hurt this during fall. Thinks maybe the last 30 days it has been problematic. More swelling on this side as well -states she just feels unbalanced and leans toward right normally. Has been 53 months. Not taking flexeril  during daytime to suspect impact. -reports parents having some similar symptoms to her and is unsure what they were found to have. States she doesn't feel dizzy, just always off-balance  -echo reviewed: IMPRESSIONS   1. Left ventricular ejection fraction, by estimation, is 60 to 65%. The left ventricle has normal function. The left ventricle has no regional wall motion abnormalities. There is mild left ventricular hypertrophy. Left ventricular diastolic parameters  are consistent with Grade I diastolic dysfunction (impaired relaxation).   2. Right ventricular systolic function is normal. The right ventricular size is normal. There is normal pulmonary artery systolic pressure. The estimated right ventricular systolic pressure is 24.0 mmHg.   3. The mitral valve is normal in structure. No evidence of mitral valve regurgitation. No evidence of mitral stenosis.   4. The aortic valve is normal in structure. Aortic valve regurgitation is not visualized. Aortic valve sclerosis is present, with no  evidence of aortic valve stenosis.   5. The inferior vena cava is normal in size with greater than 50% respiratory variability, suggesting right atrial pressure of 3 mmHg.   Current Medication: Outpatient Encounter Medications as of 10/20/2023  Medication Sig   Ascorbic Acid (VITAMIN C PO) Take 1 tablet by mouth daily.   Blood Glucose Monitoring Suppl (ACCU-CHEK GUIDE ME) w/Device KIT Use as directed. E11.65   cyclobenzaprine  (FLEXERIL ) 10 MG tablet TAKE 1 TABLET BY MOUTH DAILY AS NEEDED FOR MUSCLE SPASMS.   DULoxetine  (CYMBALTA ) 30 MG capsule Take 1 capsule (30 mg total) by mouth daily.   hydrochlorothiazide  (HYDRODIURIL ) 25 MG tablet TAKE 1 TABLET (25 MG TOTAL) BY MOUTH DAILY.   meloxicam  (MOBIC ) 15 MG tablet Take 15 mg by mouth 3 (three) times daily.   metFORMIN  (GLUCOPHAGE ) 500 MG tablet Take 1 tablet (500 mg total) by mouth daily with breakfast.   Multiple Vitamin (MULTIVITAMIN WITH MINERALS) TABS tablet Take 1 tablet by mouth daily.   Na Sulfate-K Sulfate-Mg Sulf 17.5-3.13-1.6 GM/177ML SOLN Take by mouth as directed.   nystatin -triamcinolone  ointment (MYCOLOG) Apply 1 Application topically 2 (two) times daily.   rosuvastatin  (CRESTOR ) 5 MG tablet Take 1 tablet (5 mg total) by mouth daily.   tirzepatide  (MOUNJARO ) 2.5 MG/0.5ML Pen Inject 2.5 mg into the skin once a week.   traMADol  (ULTRAM ) 50 MG tablet Take 50 mg by mouth daily at 6 (six) AM.   traZODone  (DESYREL ) 50 MG tablet TAKE 1 TABLET BY MOUTH EVERYDAY AT BEDTIME   triamcinolone  cream (  KENALOG ) 0.1 % APPLY TO AFFECTED AREA TWICE A DAY   No facility-administered encounter medications on file as of 10/20/2023.    Surgical History: Past Surgical History:  Procedure Laterality Date   ABDOMINAL HYSTERECTOMY     COLONOSCOPY WITH PROPOFOL  N/A 10/15/2021   Procedure: COLONOSCOPY WITH PROPOFOL ;  Surgeon: Therisa Bi, MD;  Location: [facility] ENDOSCOPY;  Service: Gastroenterology;  Laterality: N/A;   cyst removed Left    53 years old cyst  removed    Medical History: Past Medical History:  Diagnosis Date   Abscess of buttock, left    Diabetes mellitus without complication (HCC)    Hyperlipidemia    Hypertension    Tobacco use disorder 03/26/2021    Family History: Family History  Problem Relation Age of Onset   Hypertension Mother     Social History   Socioeconomic History   Marital status: Single    Spouse name: Not on file   Number of children: Not on file   Years of education: Not on file   Highest education level: Not on file  Occupational History   Not on file  Tobacco Use   Smoking status: Every Day    Current packs/day: 0.00    Types: Cigarettes    Last attempt to quit: 11/30/2020    Years since quitting: 2.8   Smokeless tobacco: Never   Tobacco comments:    1 pack every 2 days.  Vaping Use   Vaping status: Some Days   Substances: Flavoring  Substance and Sexual Activity   Alcohol use: No   Drug use: Not Currently    Types: Marijuana    Comment: over 30 days ago   Sexual activity: Not Currently    Partners: Male  Other Topics Concern   Not on file  Social History Narrative   Not on file   Social Drivers of Health   Financial Resource Strain: Not on file  Food Insecurity: Not on file  Transportation Needs: Not on file  Physical Activity: Not on file  Stress: Not on file  Social Connections: Not on file  Intimate Partner Violence: Not At Risk (04/02/2021)   Humiliation, Afraid, Rape, and Kick questionnaire    Fear of Current or Ex-Partner: No    Emotionally Abused: No    Physically Abused: No    Sexually Abused: No      Review of Systems  Constitutional:  Negative for chills, fatigue and unexpected weight change.  HENT:  Negative for congestion, postnasal drip, rhinorrhea, sneezing and sore throat.   Eyes:  Negative for redness.  Respiratory:  Negative for cough, chest tightness and shortness of breath.   Cardiovascular:  Positive for leg swelling. Negative for chest pain  and palpitations.  Gastrointestinal:  Negative for abdominal pain, constipation, diarrhea, nausea and vomiting.  Genitourinary:  Negative for dysuria and frequency.  Musculoskeletal:  Positive for arthralgias, back pain, joint swelling and myalgias. Negative for neck pain.  Skin:  Negative for rash.  Neurological: Negative.  Negative for tremors and numbness.       Balance issues  Hematological:  Negative for adenopathy. Bruises/bleeds easily.  Psychiatric/Behavioral:  Negative for behavioral problems (Depression) and suicidal ideas. The patient is not nervous/anxious.     Vital Signs: BP 128/70   Pulse 96   Temp 98 F (36.7 C)   Resp 16   Ht 5' 9 (1.753 m)   Wt 270 lb (122.5 kg)   SpO2 99%   BMI 39.87 kg/m  Physical Exam Vitals and nursing note reviewed.  Constitutional:      General: She is not in acute distress.    Appearance: Normal appearance. She is well-developed. She is obese. She is not diaphoretic.  HENT:     Head: Normocephalic and atraumatic.     Mouth/Throat:     Pharynx: No oropharyngeal exudate.  Eyes:     Pupils: Pupils are equal, round, and reactive to light.  Neck:     Thyroid: No thyromegaly.     Vascular: No JVD.     Trachea: No tracheal deviation.  Cardiovascular:     Rate and Rhythm: Normal rate and regular rhythm.     Heart sounds: Normal heart sounds. No murmur heard.    No friction rub. No gallop.  Pulmonary:     Effort: Pulmonary effort is normal. No respiratory distress.     Breath sounds: No wheezing or rales.  Chest:     Chest wall: No tenderness.  Musculoskeletal:        General: Swelling and signs of injury present. Normal range of motion.     Cervical back: Normal range of motion and neck supple.     Right lower leg: Edema present.     Left lower leg: Edema present.     Comments: Small bump on back of right wrist possible cyst/bone spur, is tender. Swelling in bilateral ankles, but worse on medial right ankle and tender on heel   Lymphadenopathy:     Cervical: No cervical adenopathy.  Skin:    General: Skin is warm and dry.  Neurological:     Mental Status: She is alert and oriented to person, place, and time.  Psychiatric:        Behavior: Behavior normal.        Thought Content: Thought content normal.        Judgment: Judgment normal.        Assessment/Plan: 1. Essential hypertension (Primary) Well-controlled, continue current occasion  2. LVH (left ventricular hypertrophy) Seen on echo, BP well-controlled now and will continue to monitor  3. Type 2 diabetes mellitus with other circulatory complication, without long-term current use of insulin (HCC) - POCT HgB A1C is 6.6 which is up from 6.5 at last check.  Patient reports change in insurance and would like to retry for GLP-1.  Will send in Mounjaro  - tirzepatide  (MOUNJARO ) 2.5 MG/0.5ML Pen; Inject 2.5 mg into the skin once a week.  Dispense: 2 mL; Refill: 2  4. Balance problems Given ongoing balance concerns will order CT of the head to evaluate further.  May need PT versus neurology consult - CT HEAD WO CONTRAST ( ); Future  5. Wrist pain, acute, right Wrist pain for the past 2 months since fall we will go and refer to orthopedics however if delay in getting and may need x-ray sooner - AMB referral to orthopedics  6. Right foot pain Unclear if related to fall 2 months ago but has been painful for at least the last month.  Will refer to orthopedics.  Will also check uric acid and other labs - AMB referral to orthopedics - Uric acid  7. Polyarthralgia - Uric acid - Rheumatoid Factor - CYCLIC CITRUL PEPTIDE ANTIBODY, IGG/IGA - ANA w/Reflex if Positive  8. Mixed hyperlipidemia Continue Crestor  and will update labs - Lipid Panel With LDL/HDL Ratio  9. Abnormal thyroid exam - TSH + free T4  10. Vitamin D  deficiency - VITAMIN D  25 Hydroxy (Vit-D Deficiency, Fractures)  11.  B12 deficiency - B12 and Folate Panel  12. Other fatigue -  Uric acid - Comprehensive metabolic panel with GFR - CBC w/Diff/Platelet - Lipid Panel With LDL/HDL Ratio - TSH + free T4 - Rheumatoid Factor - CYCLIC CITRUL PEPTIDE ANTIBODY, IGG/IGA - ANA w/Reflex if Positive - B12 and Folate Panel - VITAMIN D  25 Hydroxy (Vit-D Deficiency, Fractures)   General Counseling: Xavia verbalizes understanding of the findings of todays visit and agrees with plan of treatment. I have discussed any further diagnostic evaluation that may be needed or ordered today. We also reviewed her medications today. she has been encouraged to call the office with any questions or concerns that should arise related to todays visit.    Orders Placed This Encounter  Procedures   CT HEAD WO CONTRAST ( )   Uric acid   Comprehensive metabolic panel with GFR   CBC w/Diff/Platelet   Lipid Panel With LDL/HDL Ratio   TSH + free T4   Rheumatoid Factor   CYCLIC CITRUL PEPTIDE ANTIBODY, IGG/IGA   ANA w/Reflex if Positive   B12 and Folate Panel   VITAMIN D  25 Hydroxy (Vit-D Deficiency, Fractures)   AMB referral to orthopedics   POCT HgB A1C    Meds ordered this encounter  Medications   tirzepatide  (MOUNJARO ) 2.5 MG/0.5ML Pen    Sig: Inject 2.5 mg into the skin once a week.    Dispense:  2 mL    Refill:  2    This patient was seen by Tinnie Pro, PA-C in collaboration with Dr. Sigrid Bathe as a part of collaborative care agreement.   Total time spent:35 Minutes Time spent includes review of chart, medications, test results, and follow up plan with the patient.      Dr Fozia M Khan Internal medicine

## 2023-10-25 ENCOUNTER — Telehealth: Payer: Self-pay | Admitting: Physician Assistant

## 2023-10-25 ENCOUNTER — Ambulatory Visit
Admission: RE | Admit: 2023-10-25 | Discharge: 2023-10-25 | Disposition: A | Discharge status: Home / Self Care | Source: Ambulatory Visit | Attending: Physician Assistant | Admitting: Physician Assistant

## 2023-10-25 DIAGNOSIS — R2689 Other abnormalities of gait and mobility: Secondary | ICD-10-CM

## 2023-10-25 NOTE — Telephone Encounter (Signed)
 Orthopedic referral sent via [facility] to [facility].  Notified patient. Gave telephone # [phone]

## 2023-11-08 ENCOUNTER — Other Ambulatory Visit: Payer: Self-pay | Admitting: Physician Assistant

## 2023-11-08 DIAGNOSIS — G8929 Other chronic pain: Secondary | ICD-10-CM

## 2023-11-09 NOTE — Telephone Encounter (Signed)
Ok to refill with 3 refills.  

## 2023-11-11 ENCOUNTER — Other Ambulatory Visit: Payer: Self-pay | Admitting: Sports Medicine

## 2023-11-11 DIAGNOSIS — M25531 Pain in right wrist: Secondary | ICD-10-CM

## 2023-11-11 DIAGNOSIS — M67431 Ganglion, right wrist: Secondary | ICD-10-CM

## 2023-11-11 DIAGNOSIS — W19XXXA Unspecified fall, initial encounter: Secondary | ICD-10-CM

## 2023-11-14 ENCOUNTER — Encounter: Payer: Self-pay | Admitting: Physician Assistant

## 2023-11-14 ENCOUNTER — Ambulatory Visit (INDEPENDENT_AMBULATORY_CARE_PROVIDER_SITE_OTHER): Payer: Medicaid Other | Admitting: Physician Assistant

## 2023-11-14 VITALS — BP 138/88 | HR 99 | Temp 98.0°F | Resp 16 | Ht 69.0 in | Wt 271.0 lb

## 2023-11-14 DIAGNOSIS — J3489 Other specified disorders of nose and nasal sinuses: Secondary | ICD-10-CM | POA: Diagnosis not present

## 2023-11-14 DIAGNOSIS — Z0001 Encounter for general adult medical examination with abnormal findings: Secondary | ICD-10-CM

## 2023-11-14 DIAGNOSIS — M25462 Effusion, left knee: Secondary | ICD-10-CM

## 2023-11-14 DIAGNOSIS — R29898 Other symptoms and signs involving the musculoskeletal system: Secondary | ICD-10-CM

## 2023-11-14 DIAGNOSIS — R42 Dizziness and giddiness: Secondary | ICD-10-CM

## 2023-11-14 DIAGNOSIS — R2689 Other abnormalities of gait and mobility: Secondary | ICD-10-CM

## 2023-11-14 DIAGNOSIS — R928 Other abnormal and inconclusive findings on diagnostic imaging of breast: Secondary | ICD-10-CM

## 2023-11-14 DIAGNOSIS — H7491 Unspecified disorder of right middle ear and mastoid: Secondary | ICD-10-CM | POA: Diagnosis not present

## 2023-11-14 DIAGNOSIS — F320 Major depressive disorder, single episode, mild: Secondary | ICD-10-CM

## 2023-11-14 MED ORDER — BUPROPION HCL ER (XL) 150 MG PO TB24: 150.0000 mg | ORAL_TABLET | Freq: Every day | ORAL | 2 refills | Status: DC

## 2023-11-14 NOTE — Progress Notes (Signed)
 [facility] [address] [place], [place] [address]  Internal MEDICINE  Office Visit Note  Patient Name: Kelsey Sampson  [id number]  [id number]  Date of Service: 11/17/2023  Chief Complaint  Patient presents with   Annual Exam   Diabetes   Hypertension   Hyperlipidemia   Quality Metric Gaps    Mammogram   Joint Swelling    Left knee swelling began last Monday, not accompanied by any pain     HPI Pt is here for routine health maintenance examination -colonoscopy UTD -UTD on pap -overdue for mammogram, last abnormal but did not have repeat with US  and will order now -MRI of wrist in a few days, has brace for it and bump on wrist. Xray neg -CT head reviewed:  IMPRESSION: 1. No acute intracranial abnormality. 2. Small right mastoid effusion. 3. Mucosal thickening in the partially visualized left maxillary sinus. -still feels balance problems and does not chronic congestion. Will refer to ENT for further evaluation -will get labs done -left knee feels swollen, nontender. States she had a brace a long time ago. Also right knee pops. Would like to address with ortho, but current referral was only for wrist. Will add new concern -mounjaro  picked up but hasn't started on it yet -more irritable, taking cymbalta   Current Medication: Outpatient Encounter Medications as of 11/14/2023  Medication Sig   Ascorbic Acid (VITAMIN C PO) Take 1 tablet by mouth daily.   Blood Glucose Monitoring Suppl (ACCU-CHEK GUIDE ME) w/Device KIT Use as directed. E11.65   buPROPion  (WELLBUTRIN  XL) 150 MG 24 hr tablet Take 1 tablet (150 mg total) by mouth daily.   cyclobenzaprine  (FLEXERIL ) 10 MG tablet TAKE 1 TABLET BY MOUTH DAILY AS NEEDED FOR MUSCLE SPASMS.   DULoxetine  (CYMBALTA ) 30 MG capsule Take 1 capsule (30 mg total) by mouth daily.   hydrochlorothiazide  (HYDRODIURIL ) 25 MG tablet TAKE 1 TABLET (25 MG TOTAL) BY MOUTH DAILY.   meloxicam  (MOBIC ) 15 MG tablet Take 15 mg by mouth 3  (three) times daily.   metFORMIN  (GLUCOPHAGE ) 500 MG tablet Take 1 tablet (500 mg total) by mouth daily with breakfast.   Multiple Vitamin (MULTIVITAMIN WITH MINERALS) TABS tablet Take 1 tablet by mouth daily.   Na Sulfate-K Sulfate-Mg Sulf 17.5-3.13-1.6 GM/177ML SOLN Take by mouth as directed.   nystatin -triamcinolone  ointment (MYCOLOG) Apply 1 Application topically 2 (two) times daily.   rosuvastatin  (CRESTOR ) 5 MG tablet Take 1 tablet (5 mg total) by mouth daily.   tirzepatide  (MOUNJARO ) 2.5 MG/0.5ML Pen Inject 2.5 mg into the skin once a week.   traMADol  (ULTRAM ) 50 MG tablet Take 50 mg by mouth daily at 6 (six) AM.   traZODone  (DESYREL ) 50 MG tablet TAKE 1 TABLET BY MOUTH EVERYDAY AT BEDTIME   triamcinolone  cream (KENALOG ) 0.1 % APPLY TO AFFECTED AREA TWICE A DAY   No facility-administered encounter medications on file as of 11/14/2023.    Surgical History: Past Surgical History:  Procedure Laterality Date   ABDOMINAL HYSTERECTOMY     COLONOSCOPY WITH PROPOFOL  N/A 10/15/2021   Procedure: COLONOSCOPY WITH PROPOFOL ;  Surgeon: Therisa Bi, MD;  Location: [facility] ENDOSCOPY;  Service: Gastroenterology;  Laterality: N/A;   cyst removed Left    53 years old cyst removed    Medical History: Past Medical History:  Diagnosis Date   Abscess of buttock, left    Diabetes mellitus without complication (HCC)    Hyperlipidemia    Hypertension    Tobacco use disorder 03/26/2021  Family History: Family History  Problem Relation Age of Onset   Hypertension Mother    Breast cancer Neg Hx       Review of Systems  Constitutional:  Negative for chills, fatigue and unexpected weight change.  HENT:  Positive for congestion and postnasal drip. Negative for rhinorrhea, sneezing and sore throat.   Eyes:  Negative for redness.  Respiratory:  Negative for cough, chest tightness and shortness of breath.   Cardiovascular:  Negative for chest pain and palpitations.  Gastrointestinal:  Negative for  abdominal pain, constipation, diarrhea, nausea and vomiting.  Genitourinary:  Negative for dysuria and frequency.  Musculoskeletal:  Positive for arthralgias, back pain, joint swelling and myalgias. Negative for neck pain.  Skin:  Negative for rash.  Neurological: Negative.  Negative for tremors and numbness.       Balance issues  Hematological:  Negative for adenopathy. Bruises/bleeds easily.  Psychiatric/Behavioral:  Negative for behavioral problems (Depression) and suicidal ideas. The patient is not nervous/anxious.      Vital Signs: BP 138/88 Comment: 130/95  Pulse 99   Temp 98 F (36.7 C)   Resp 16   Ht 5' 9 (1.753 m)   Wt 271 lb (122.9 kg)   SpO2 95%   BMI 40.02 kg/m    Physical Exam Vitals and nursing note reviewed.  Constitutional:      General: She is not in acute distress.    Appearance: Normal appearance. She is well-developed. She is obese. She is not diaphoretic.  HENT:     Head: Normocephalic and atraumatic.     Mouth/Throat:     Pharynx: No oropharyngeal exudate.  Eyes:     Pupils: Pupils are equal, round, and reactive to light.  Neck:     Thyroid: No thyromegaly.     Vascular: No JVD.     Trachea: No tracheal deviation.  Cardiovascular:     Rate and Rhythm: Normal rate and regular rhythm.     Heart sounds: Normal heart sounds. No murmur heard.    No friction rub. No gallop.  Pulmonary:     Effort: Pulmonary effort is normal. No respiratory distress.     Breath sounds: No wheezing or rales.  Chest:     Chest wall: No tenderness.  Abdominal:     Tenderness: There is no abdominal tenderness.  Musculoskeletal:        General: Normal range of motion.     Cervical back: Normal range of motion and neck supple.     Comments: Small bump on back of right wrist possible cyst/bone spur  Lymphadenopathy:     Cervical: No cervical adenopathy.  Skin:    General: Skin is warm and dry.  Neurological:     Mental Status: She is alert and oriented to person,  place, and time.  Psychiatric:        Behavior: Behavior normal.        Thought Content: Thought content normal.        Judgment: Judgment normal.      LABS: Recent Results (from the past 2160 hours)  ECHOCARDIOGRAM COMPLETE     Status: None   Collection Time: 09/28/23 11:49 AM  Result Value Ref Range   Ao pk vel 1.02 m/s   AV Area VTI 3.31 cm2   AR max vel 2.99 cm2   AV Mean grad 2.5 mmHg   AV Peak grad 4.1 mmHg   S' Lateral 2.50 cm   AV Area mean vel 2.65 cm2  Area-P 1/2 3.89 cm2   MV VTI 2.75 cm2   Est EF 60 - 65%   POCT HgB A1C     Status: Abnormal   Collection Time: 10/20/23  9:47 AM  Result Value Ref Range   Hemoglobin A1C 6.6 (A) 4.0 - 5.6 %   HbA1c POC (<> result, manual entry)     HbA1c, POC (prediabetic range)     HbA1c, POC (controlled diabetic range)          Assessment/Plan: 1. Encounter for general adult medical examination with abnormal findings (Primary) Cpe performed, due for mammogram/US , will have labs done  2. Sinus mucosal thickening Will refer to ENT due to chronic congestion and abnormal CT finding - Ambulatory referral to ENT  3. Disorder of right mastoid Will refer to ENT - Ambulatory referral to ENT  4. Balance problems - Ambulatory referral to ENT - AMB referral to orthopedics  5. Lightheadedness - Ambulatory referral to ENT  6. Depression, major, single episode, mild Will start wellbutrin  to help further and continue cymbalta  - buPROPion  (WELLBUTRIN  XL) 150 MG 24 hr tablet; Take 1 tablet (150 mg total) by mouth daily.  Dispense: 30 tablet; Refill: 2  7. Swelling of left knee - AMB referral to orthopedics  8. Popping of right knee joint - AMB referral to orthopedics  9. Abnormal mammogram of right breast - MM 3D DIAGNOSTIC MAMMOGRAM BILATERAL BREAST; Future - US  LIMITED ULTRASOUND INCLUDING AXILLA RIGHT BREAST; Future   General Counseling: Sande verbalizes understanding of the findings of todays visit and agrees with  plan of treatment. I have discussed any further diagnostic evaluation that may be needed or ordered today. We also reviewed her medications today. she has been encouraged to call the office with any questions or concerns that should arise related to todays visit.    Counseling:    Orders Placed This Encounter  Procedures   MM 3D DIAGNOSTIC MAMMOGRAM BILATERAL BREAST   US  LIMITED ULTRASOUND INCLUDING AXILLA RIGHT BREAST   Ambulatory referral to ENT   AMB referral to orthopedics    Meds ordered this encounter  Medications   buPROPion  (WELLBUTRIN  XL) 150 MG 24 hr tablet    Sig: Take 1 tablet (150 mg total) by mouth daily.    Dispense:  30 tablet    Refill:  2    This patient was seen by Tinnie Pro, PA-C in collaboration with Dr. Sigrid Bathe as a part of collaborative care agreement.  Total time spent:40 Minutes  Time spent includes review of chart, medications, test results, and follow up plan with the patient.     Sigrid CHRISTELLA Bathe, MD  Internal Medicine

## 2023-11-15 ENCOUNTER — Telehealth: Payer: Self-pay | Admitting: Physician Assistant

## 2023-11-15 NOTE — Telephone Encounter (Signed)
 Awaiting 11/14/23 office notes for Otolaryngology & Orthopedic referral-Toni

## 2023-11-16 ENCOUNTER — Ambulatory Visit
Admission: RE | Admit: 2023-11-16 | Discharge: 2023-11-16 | Disposition: A | Discharge status: Home / Self Care | Source: Ambulatory Visit | Attending: Physician Assistant | Admitting: Physician Assistant

## 2023-11-16 DIAGNOSIS — R928 Other abnormal and inconclusive findings on diagnostic imaging of breast: Secondary | ICD-10-CM | POA: Insufficient documentation

## 2023-11-17 ENCOUNTER — Telehealth: Payer: Self-pay | Admitting: Physician Assistant

## 2023-11-17 NOTE — Telephone Encounter (Signed)
 Orthopedic appointment was 11/03/2023 with [facility]-Toni

## 2023-11-17 NOTE — Telephone Encounter (Signed)
 Otolaryngology referral sent via [facility] to [facility]. Notified patient. Gave telephone # [phone]   Orthopedic referral for knees sent via [facility] to [facility]. Notified patient. Patient already had their telephone #-Toni

## 2023-11-18 ENCOUNTER — Ambulatory Visit
Admission: RE | Admit: 2023-11-18 | Discharge: 2023-11-18 | Disposition: A | Discharge status: Home / Self Care | Source: Ambulatory Visit | Attending: Sports Medicine | Admitting: Sports Medicine

## 2023-11-18 DIAGNOSIS — W19XXXA Unspecified fall, initial encounter: Secondary | ICD-10-CM | POA: Diagnosis not present

## 2023-11-18 DIAGNOSIS — M25531 Pain in right wrist: Secondary | ICD-10-CM | POA: Diagnosis present

## 2023-11-18 DIAGNOSIS — M67431 Ganglion, right wrist: Secondary | ICD-10-CM | POA: Insufficient documentation

## 2023-11-18 DIAGNOSIS — M19031 Primary osteoarthritis, right wrist: Secondary | ICD-10-CM | POA: Insufficient documentation

## 2023-11-23 ENCOUNTER — Telehealth: Payer: Self-pay | Admitting: Physician Assistant

## 2023-11-23 NOTE — Telephone Encounter (Signed)
 Orthopedic appointment 11/29/2023 with [facility]-Toni

## 2023-12-05 ENCOUNTER — Ambulatory Visit: Admitting: Physician Assistant

## 2023-12-07 ENCOUNTER — Other Ambulatory Visit: Payer: Self-pay | Admitting: Physician Assistant

## 2023-12-07 DIAGNOSIS — M797 Fibromyalgia: Secondary | ICD-10-CM

## 2023-12-15 ENCOUNTER — Encounter: Payer: Self-pay | Admitting: Physician Assistant

## 2023-12-15 ENCOUNTER — Other Ambulatory Visit: Payer: Self-pay | Admitting: Physician Assistant

## 2023-12-15 ENCOUNTER — Ambulatory Visit: Admitting: Physician Assistant

## 2023-12-15 VITALS — BP 152/100 | HR 93 | Temp 98.3°F | Resp 16 | Ht 69.0 in | Wt 268.0 lb

## 2023-12-15 DIAGNOSIS — E1159 Type 2 diabetes mellitus with other circulatory complications: Secondary | ICD-10-CM | POA: Diagnosis not present

## 2023-12-15 DIAGNOSIS — J3489 Other specified disorders of nose and nasal sinuses: Secondary | ICD-10-CM

## 2023-12-15 DIAGNOSIS — F330 Major depressive disorder, recurrent, mild: Secondary | ICD-10-CM

## 2023-12-15 DIAGNOSIS — I10 Essential (primary) hypertension: Secondary | ICD-10-CM

## 2023-12-15 DIAGNOSIS — F320 Major depressive disorder, single episode, mild: Secondary | ICD-10-CM

## 2023-12-15 DIAGNOSIS — E669 Obesity, unspecified: Secondary | ICD-10-CM

## 2023-12-15 NOTE — Progress Notes (Signed)
 [facility] [address] [place], [place] [address]  Internal MEDICINE  Office Visit Note  Patient Name: Kelsey Sampson  [id number]  [id number]  Date of Service: 12/16/2023  Chief Complaint  Patient presents with   Follow-up   Diabetes   Hyperlipidemia   Hypertension    HPI Pt is here for routine follow up -States bill at ENT from 30 years ago so they would not see her. Will see about different location -ortho did injection for ganglion cyst and told she had arthritis, given mobic . States knees not really addressed other than xray showing arthritis -Doing well with bupropion  -states she owed money before [facility] would draw and is going back next week -tolerating mounjaro , wants to stay at current dose, down 3lbs since last visit  Current Medication: Outpatient Encounter Medications as of 12/15/2023  Medication Sig   Ascorbic Acid (VITAMIN C PO) Take 1 tablet by mouth daily.   cyclobenzaprine  (FLEXERIL ) 10 MG tablet TAKE 1 TABLET BY MOUTH DAILY AS NEEDED FOR MUSCLE SPASMS.   DULoxetine  (CYMBALTA ) 30 MG capsule TAKE 1 CAPSULE BY MOUTH EVERY DAY   hydrochlorothiazide  (HYDRODIURIL ) 25 MG tablet TAKE 1 TABLET (25 MG TOTAL) BY MOUTH DAILY.   meloxicam  (MOBIC ) 15 MG tablet Take 15 mg by mouth 3 (three) times daily.   metFORMIN  (GLUCOPHAGE ) 500 MG tablet Take 1 tablet (500 mg total) by mouth daily with breakfast.   Multiple Vitamin (MULTIVITAMIN WITH MINERALS) TABS tablet Take 1 tablet by mouth daily.   Na Sulfate-K Sulfate-Mg Sulf 17.5-3.13-1.6 GM/177ML SOLN Take by mouth as directed.   nystatin -triamcinolone  ointment (MYCOLOG) Apply 1 Application topically 2 (two) times daily.   rosuvastatin  (CRESTOR ) 5 MG tablet Take 1 tablet (5 mg total) by mouth daily.   tirzepatide  (MOUNJARO ) 2.5 MG/0.5ML Pen Inject 2.5 mg into the skin once a week.   triamcinolone  cream (KENALOG ) 0.1 % APPLY TO AFFECTED AREA TWICE A DAY   [DISCONTINUED] Blood Glucose Monitoring Suppl (ACCU-CHEK  GUIDE ME) w/Device KIT Use as directed. E11.65   [DISCONTINUED] buPROPion  (WELLBUTRIN  XL) 150 MG 24 hr tablet Take 1 tablet (150 mg total) by mouth daily.   [DISCONTINUED] traMADol  (ULTRAM ) 50 MG tablet Take 50 mg by mouth daily at 6 (six) AM.   [DISCONTINUED] traZODone  (DESYREL ) 50 MG tablet TAKE 1 TABLET BY MOUTH EVERYDAY AT BEDTIME   No facility-administered encounter medications on file as of 12/15/2023.    Surgical History: Past Surgical History:  Procedure Laterality Date   ABDOMINAL HYSTERECTOMY     COLONOSCOPY WITH PROPOFOL  N/A 10/15/2021   Procedure: COLONOSCOPY WITH PROPOFOL ;  Surgeon: Therisa Bi, MD;  Location: [facility] ENDOSCOPY;  Service: Gastroenterology;  Laterality: N/A;   cyst removed Left    53 years old cyst removed    Medical History: Past Medical History:  Diagnosis Date   Abscess of buttock, left    Diabetes mellitus without complication (HCC)    Hyperlipidemia    Hypertension    Tobacco use disorder 03/26/2021    Family History: Family History  Problem Relation Age of Onset   Hypertension Mother    Breast cancer Neg Hx     Social History   Socioeconomic History   Marital status: Single    Spouse name: Not on file   Number of children: Not on file   Years of education: Not on file   Highest education level: Not on file  Occupational History   Not on file  Tobacco Use   Smoking status: Every Day  Current packs/day: 0.00    Types: Cigarettes    Last attempt to quit: 11/30/2020    Years since quitting: 3.0   Smokeless tobacco: Never   Tobacco comments:    1 pack every 2 days.  Vaping Use   Vaping status: Some Days   Substances: Flavoring  Substance and Sexual Activity   Alcohol use: No   Drug use: Not Currently    Types: Marijuana    Comment: over 30 days ago   Sexual activity: Not Currently    Partners: Male  Other Topics Concern   Not on file  Social History Narrative   Not on file   Social Drivers of Health   Financial Resource  Strain: Not on file  Food Insecurity: Not on file  Transportation Needs: Not on file  Physical Activity: Not on file  Stress: Not on file  Social Connections: Not on file  Intimate Partner Violence: Not At Risk (04/02/2021)   Humiliation, Afraid, Rape, and Kick questionnaire    Fear of Current or Ex-Partner: No    Emotionally Abused: No    Physically Abused: No    Sexually Abused: No      Review of Systems  Constitutional:  Negative for chills, fatigue and unexpected weight change.  HENT:  Negative for congestion, postnasal drip, rhinorrhea, sneezing and sore throat.   Eyes:  Negative for redness.  Respiratory:  Negative for cough, chest tightness and shortness of breath.   Cardiovascular:  Negative for chest pain and palpitations.  Gastrointestinal:  Negative for abdominal pain, constipation, diarrhea, nausea and vomiting.  Genitourinary:  Negative for dysuria and frequency.  Musculoskeletal:  Positive for arthralgias, back pain and myalgias. Negative for neck pain.  Skin:  Negative for rash.  Neurological: Negative.  Negative for tremors and numbness.       Balance issues  Hematological:  Negative for adenopathy.  Psychiatric/Behavioral:  Negative for behavioral problems (Depression) and suicidal ideas. The patient is not nervous/anxious.     Vital Signs: BP (!) 140/90 Comment: 152/100  Pulse 93   Temp 98.3 F (36.8 C)   Resp 16   Ht 5' 9 (1.753 m)   Wt 268 lb (121.6 kg)   SpO2 96%   BMI 39.58 kg/m    Physical Exam Vitals and nursing note reviewed.  Constitutional:      General: She is not in acute distress.    Appearance: Normal appearance. She is well-developed. She is obese. She is not diaphoretic.  HENT:     Head: Normocephalic and atraumatic.  Eyes:     Extraocular Movements: Extraocular movements intact.  Neck:     Thyroid: No thyromegaly.     Vascular: No JVD.     Trachea: No tracheal deviation.  Cardiovascular:     Rate and Rhythm: Normal rate and  regular rhythm.     Heart sounds: Normal heart sounds. No murmur heard.    No friction rub. No gallop.  Pulmonary:     Effort: Pulmonary effort is normal. No respiratory distress.     Breath sounds: No wheezing or rales.  Chest:     Chest wall: No tenderness.  Skin:    General: Skin is warm and dry.  Neurological:     Mental Status: She is alert and oriented to person, place, and time.     Cranial Nerves: No cranial nerve deficit.  Psychiatric:        Behavior: Behavior normal.        Thought Content:  Thought content normal.        Judgment: Judgment normal.        Assessment/Plan: 1. Essential hypertension (Primary) Borderline in office, but aggravated discussing outstanding bills at labs and ENT. Will monitor  2. Sinus mucosal thickening Will see about alternative ENT location  3. Mild episode of recurrent major depressive disorder Improving, continue wellbutrin  and cymbalta   4. Type 2 diabetes mellitus with other circulatory complication, without long-term current use of insulin (HCC) Will continue mounjaro  at current dose and monitor  5. Obesity (BMI 30-39.9) Down 3 lbs since last visit and will continue to work on this and take mounjaro  for BG and wt loss benefits     General Counseling: Kelsey Sampson verbalizes understanding of the findings of todays visit and agrees with plan of treatment. I have discussed any further diagnostic evaluation that may be needed or ordered today. We also reviewed her medications today. she has been encouraged to call the office with any questions or concerns that should arise related to todays visit.    No orders of the defined types were placed in this encounter.   No orders of the defined types were placed in this encounter.   This patient was seen by Tinnie Pro, PA-C in collaboration with Dr. Sigrid Bathe as a part of collaborative care agreement.   Total time spent:30 Minutes Time spent includes review of chart, medications,  test results, and follow up plan with the patient.      Dr Fozia M Khan Internal medicine

## 2023-12-20 ENCOUNTER — Telehealth: Payer: Self-pay | Admitting: Physician Assistant

## 2023-12-20 NOTE — Telephone Encounter (Signed)
 Otolaryngology referral faxed to [facility]; [phone]. Notified patient. Gave pt telephone [phone]

## 2024-02-21 ENCOUNTER — Telehealth: Payer: Self-pay | Admitting: Physician Assistant

## 2024-02-21 ENCOUNTER — Other Ambulatory Visit: Payer: Self-pay | Admitting: Physician Assistant

## 2024-02-21 DIAGNOSIS — B372 Candidiasis of skin and nail: Secondary | ICD-10-CM

## 2024-02-21 NOTE — Telephone Encounter (Signed)
"  Pt notified med sent  "

## 2024-03-15 ENCOUNTER — Ambulatory Visit: Admitting: Physician Assistant

## 2024-03-15 ENCOUNTER — Encounter: Payer: Self-pay | Admitting: Physician Assistant

## 2024-03-15 VITALS — BP 122/103 | HR 106 | Temp 98.0°F | Resp 16 | Ht 69.0 in | Wt 267.0 lb

## 2024-03-15 DIAGNOSIS — F4310 Post-traumatic stress disorder, unspecified: Secondary | ICD-10-CM | POA: Diagnosis not present

## 2024-03-15 DIAGNOSIS — I10 Essential (primary) hypertension: Secondary | ICD-10-CM

## 2024-03-15 DIAGNOSIS — E1159 Type 2 diabetes mellitus with other circulatory complications: Secondary | ICD-10-CM | POA: Diagnosis not present

## 2024-03-15 DIAGNOSIS — M2550 Pain in unspecified joint: Secondary | ICD-10-CM

## 2024-03-15 LAB — POCT GLYCOSYLATED HEMOGLOBIN (HGB A1C): Hemoglobin A1C: 6.8 % — AB (ref 4.0–5.6)

## 2024-03-15 MED ORDER — PROPRANOLOL HCL 10 MG PO TABS: 10.0000 mg | ORAL_TABLET | Freq: Three times a day (TID) | ORAL | 2 refills | Status: AC

## 2024-03-15 MED ORDER — TIRZEPATIDE 2.5 MG/0.5ML ~~LOC~~ SOAJ: 2.5000 mg | SUBCUTANEOUS | 2 refills | Status: AC

## 2024-03-15 MED ORDER — HYDROXYZINE HCL 10 MG PO TABS: 10.0000 mg | ORAL_TABLET | Freq: Three times a day (TID) | ORAL | 0 refills | Status: AC | PRN

## 2024-03-15 MED ORDER — DICLOFENAC SODIUM 50 MG PO TBEC: 50.0000 mg | DELAYED_RELEASE_TABLET | Freq: Every day | ORAL | 2 refills | Status: AC | PRN

## 2024-03-15 MED ORDER — AMLODIPINE BESYLATE 2.5 MG PO TABS: 2.5000 mg | ORAL_TABLET | Freq: Every day | ORAL | 2 refills | Status: AC
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
## 2024-03-20 ENCOUNTER — Telehealth: Payer: Self-pay | Admitting: Physician Assistant

## 2024-03-20 NOTE — Telephone Encounter (Signed)
 Psychology referral faxed to [facility]; [phone]. Notified patient. She stated they have already reached out to her-Kelsey Sampson

## 2024-04-12 ENCOUNTER — Ambulatory Visit: Admitting: Physician Assistant

## 2024-11-15 ENCOUNTER — Encounter: Admitting: Physician Assistant
